# Patient Record
Sex: Female | Born: 1955 | Race: White | Hispanic: No | Marital: Married | Smoking: Former smoker
Health system: Southern US, Community
[De-identification: ages and names within clinical notes are randomized; demographics above are authoritative.]

## PROBLEM LIST (undated history)

## (undated) DIAGNOSIS — E785 Hyperlipidemia, unspecified: Secondary | ICD-10-CM

## (undated) DIAGNOSIS — I10 Essential (primary) hypertension: Secondary | ICD-10-CM

## (undated) DIAGNOSIS — M3313 Other dermatomyositis without myopathy: Secondary | ICD-10-CM

## (undated) HISTORY — DX: Essential (primary) hypertension: I10

## (undated) HISTORY — DX: Hyperlipidemia, unspecified: E78.5

## (undated) HISTORY — PX: KNEE ARTHROSCOPY: SHX127

## (undated) HISTORY — DX: Other dermatomyositis without myopathy: M33.13

## (undated) HISTORY — PX: CATARACT EXTRACTION: SUR2

## (undated) HISTORY — PX: WRIST SURGERY: SHX841

---

## 1998-07-23 ENCOUNTER — Other Ambulatory Visit: Admission: RE | Admit: 1998-07-23 | Discharge: 1998-07-23 | Payer: Self-pay | Admitting: Obstetrics and Gynecology

## 2003-04-21 ENCOUNTER — Other Ambulatory Visit: Admission: RE | Admit: 2003-04-21 | Discharge: 2003-04-21 | Payer: Self-pay | Admitting: Obstetrics and Gynecology

## 2010-09-17 ENCOUNTER — Ambulatory Visit
Admission: RE | Admit: 2010-09-17 | Discharge: 2010-09-17 | Payer: Self-pay | Source: Home / Self Care | Attending: Orthopedic Surgery | Admitting: Orthopedic Surgery

## 2019-01-09 ENCOUNTER — Other Ambulatory Visit: Payer: Self-pay | Admitting: Family Medicine

## 2019-01-09 DIAGNOSIS — F988 Other specified behavioral and emotional disorders with onset usually occurring in childhood and adolescence: Secondary | ICD-10-CM | POA: Diagnosis not present

## 2019-01-09 DIAGNOSIS — E7800 Pure hypercholesterolemia, unspecified: Secondary | ICD-10-CM | POA: Diagnosis not present

## 2019-01-09 DIAGNOSIS — K219 Gastro-esophageal reflux disease without esophagitis: Secondary | ICD-10-CM | POA: Diagnosis not present

## 2019-01-09 DIAGNOSIS — Z1231 Encounter for screening mammogram for malignant neoplasm of breast: Secondary | ICD-10-CM

## 2019-02-11 DIAGNOSIS — I349 Nonrheumatic mitral valve disorder, unspecified: Secondary | ICD-10-CM | POA: Diagnosis not present

## 2019-02-11 DIAGNOSIS — Z1211 Encounter for screening for malignant neoplasm of colon: Secondary | ICD-10-CM | POA: Diagnosis not present

## 2019-02-11 DIAGNOSIS — E782 Mixed hyperlipidemia: Secondary | ICD-10-CM | POA: Diagnosis not present

## 2019-02-14 DIAGNOSIS — H25812 Combined forms of age-related cataract, left eye: Secondary | ICD-10-CM | POA: Diagnosis not present

## 2019-02-14 DIAGNOSIS — H5213 Myopia, bilateral: Secondary | ICD-10-CM | POA: Diagnosis not present

## 2019-02-14 DIAGNOSIS — H40053 Ocular hypertension, bilateral: Secondary | ICD-10-CM | POA: Diagnosis not present

## 2019-02-14 DIAGNOSIS — H353131 Nonexudative age-related macular degeneration, bilateral, early dry stage: Secondary | ICD-10-CM | POA: Diagnosis not present

## 2019-03-18 DIAGNOSIS — Z1231 Encounter for screening mammogram for malignant neoplasm of breast: Secondary | ICD-10-CM | POA: Diagnosis not present

## 2019-04-18 ENCOUNTER — Ambulatory Visit: Payer: Self-pay

## 2019-04-23 DIAGNOSIS — K5730 Diverticulosis of large intestine without perforation or abscess without bleeding: Secondary | ICD-10-CM | POA: Diagnosis not present

## 2019-04-23 DIAGNOSIS — Z1211 Encounter for screening for malignant neoplasm of colon: Secondary | ICD-10-CM | POA: Diagnosis not present

## 2019-04-23 DIAGNOSIS — D122 Benign neoplasm of ascending colon: Secondary | ICD-10-CM | POA: Diagnosis not present

## 2019-04-23 DIAGNOSIS — K635 Polyp of colon: Secondary | ICD-10-CM | POA: Diagnosis not present

## 2019-05-17 DIAGNOSIS — L239 Allergic contact dermatitis, unspecified cause: Secondary | ICD-10-CM | POA: Diagnosis not present

## 2019-06-05 DIAGNOSIS — Z23 Encounter for immunization: Secondary | ICD-10-CM | POA: Diagnosis not present

## 2019-06-05 DIAGNOSIS — Z0000 Encounter for general adult medical examination without abnormal findings: Secondary | ICD-10-CM | POA: Diagnosis not present

## 2019-06-05 DIAGNOSIS — E7800 Pure hypercholesterolemia, unspecified: Secondary | ICD-10-CM | POA: Diagnosis not present

## 2019-06-05 DIAGNOSIS — Z136 Encounter for screening for cardiovascular disorders: Secondary | ICD-10-CM | POA: Diagnosis not present

## 2019-06-25 DIAGNOSIS — H25812 Combined forms of age-related cataract, left eye: Secondary | ICD-10-CM | POA: Diagnosis not present

## 2019-06-25 DIAGNOSIS — H268 Other specified cataract: Secondary | ICD-10-CM | POA: Diagnosis not present

## 2019-08-08 DIAGNOSIS — H40053 Ocular hypertension, bilateral: Secondary | ICD-10-CM | POA: Diagnosis not present

## 2020-02-06 DIAGNOSIS — M1711 Unilateral primary osteoarthritis, right knee: Secondary | ICD-10-CM | POA: Diagnosis not present

## 2020-02-25 DIAGNOSIS — S83281A Other tear of lateral meniscus, current injury, right knee, initial encounter: Secondary | ICD-10-CM | POA: Diagnosis not present

## 2020-03-05 DIAGNOSIS — M25561 Pain in right knee: Secondary | ICD-10-CM | POA: Diagnosis not present

## 2020-03-23 DIAGNOSIS — Z1231 Encounter for screening mammogram for malignant neoplasm of breast: Secondary | ICD-10-CM | POA: Diagnosis not present

## 2020-04-09 DIAGNOSIS — M1711 Unilateral primary osteoarthritis, right knee: Secondary | ICD-10-CM | POA: Diagnosis not present

## 2020-04-13 DIAGNOSIS — H6123 Impacted cerumen, bilateral: Secondary | ICD-10-CM | POA: Diagnosis not present

## 2020-04-29 DIAGNOSIS — H40053 Ocular hypertension, bilateral: Secondary | ICD-10-CM | POA: Diagnosis not present

## 2020-04-29 DIAGNOSIS — H26493 Other secondary cataract, bilateral: Secondary | ICD-10-CM | POA: Diagnosis not present

## 2020-04-29 DIAGNOSIS — H524 Presbyopia: Secondary | ICD-10-CM | POA: Diagnosis not present

## 2020-05-20 DIAGNOSIS — Z20828 Contact with and (suspected) exposure to other viral communicable diseases: Secondary | ICD-10-CM | POA: Diagnosis not present

## 2020-06-11 DIAGNOSIS — H26491 Other secondary cataract, right eye: Secondary | ICD-10-CM | POA: Diagnosis not present

## 2020-08-06 DIAGNOSIS — E7800 Pure hypercholesterolemia, unspecified: Secondary | ICD-10-CM | POA: Diagnosis not present

## 2020-08-10 DIAGNOSIS — Z0000 Encounter for general adult medical examination without abnormal findings: Secondary | ICD-10-CM | POA: Diagnosis not present

## 2020-08-10 DIAGNOSIS — H6091 Unspecified otitis externa, right ear: Secondary | ICD-10-CM | POA: Diagnosis not present

## 2020-08-10 DIAGNOSIS — E7800 Pure hypercholesterolemia, unspecified: Secondary | ICD-10-CM | POA: Diagnosis not present

## 2020-08-10 DIAGNOSIS — F988 Other specified behavioral and emotional disorders with onset usually occurring in childhood and adolescence: Secondary | ICD-10-CM | POA: Diagnosis not present

## 2020-08-10 DIAGNOSIS — Z23 Encounter for immunization: Secondary | ICD-10-CM | POA: Diagnosis not present

## 2020-08-10 DIAGNOSIS — F900 Attention-deficit hyperactivity disorder, predominantly inattentive type: Secondary | ICD-10-CM | POA: Diagnosis not present

## 2020-08-10 DIAGNOSIS — K219 Gastro-esophageal reflux disease without esophagitis: Secondary | ICD-10-CM | POA: Diagnosis not present

## 2020-08-10 DIAGNOSIS — L309 Dermatitis, unspecified: Secondary | ICD-10-CM | POA: Diagnosis not present

## 2020-08-10 DIAGNOSIS — R6889 Other general symptoms and signs: Secondary | ICD-10-CM | POA: Diagnosis not present

## 2020-08-11 DIAGNOSIS — Z23 Encounter for immunization: Secondary | ICD-10-CM | POA: Diagnosis not present

## 2020-08-31 DIAGNOSIS — R7989 Other specified abnormal findings of blood chemistry: Secondary | ICD-10-CM | POA: Diagnosis not present

## 2020-10-21 DIAGNOSIS — L821 Other seborrheic keratosis: Secondary | ICD-10-CM | POA: Diagnosis not present

## 2020-10-21 DIAGNOSIS — K130 Diseases of lips: Secondary | ICD-10-CM | POA: Diagnosis not present

## 2020-10-21 DIAGNOSIS — L578 Other skin changes due to chronic exposure to nonionizing radiation: Secondary | ICD-10-CM | POA: Diagnosis not present

## 2020-10-21 DIAGNOSIS — D485 Neoplasm of uncertain behavior of skin: Secondary | ICD-10-CM | POA: Diagnosis not present

## 2020-10-21 DIAGNOSIS — L089 Local infection of the skin and subcutaneous tissue, unspecified: Secondary | ICD-10-CM | POA: Diagnosis not present

## 2020-10-21 DIAGNOSIS — L814 Other melanin hyperpigmentation: Secondary | ICD-10-CM | POA: Diagnosis not present

## 2020-10-21 DIAGNOSIS — L309 Dermatitis, unspecified: Secondary | ICD-10-CM | POA: Diagnosis not present

## 2020-10-28 DIAGNOSIS — M3390 Dermatopolymyositis, unspecified, organ involvement unspecified: Secondary | ICD-10-CM | POA: Diagnosis not present

## 2020-10-29 DIAGNOSIS — M3390 Dermatopolymyositis, unspecified, organ involvement unspecified: Secondary | ICD-10-CM | POA: Diagnosis not present

## 2020-10-29 DIAGNOSIS — R059 Cough, unspecified: Secondary | ICD-10-CM | POA: Diagnosis not present

## 2020-11-04 DIAGNOSIS — Z124 Encounter for screening for malignant neoplasm of cervix: Secondary | ICD-10-CM | POA: Diagnosis not present

## 2020-11-04 DIAGNOSIS — M3310 Other dermatopolymyositis, organ involvement unspecified: Secondary | ICD-10-CM | POA: Diagnosis not present

## 2020-11-04 DIAGNOSIS — Z01411 Encounter for gynecological examination (general) (routine) with abnormal findings: Secondary | ICD-10-CM | POA: Diagnosis not present

## 2020-11-04 DIAGNOSIS — N83202 Unspecified ovarian cyst, left side: Secondary | ICD-10-CM | POA: Diagnosis not present

## 2020-11-04 DIAGNOSIS — Z01419 Encounter for gynecological examination (general) (routine) without abnormal findings: Secondary | ICD-10-CM | POA: Diagnosis not present

## 2020-11-04 DIAGNOSIS — Z1273 Encounter for screening for malignant neoplasm of ovary: Secondary | ICD-10-CM | POA: Diagnosis not present

## 2020-11-04 DIAGNOSIS — Z6825 Body mass index (BMI) 25.0-25.9, adult: Secondary | ICD-10-CM | POA: Diagnosis not present

## 2020-11-09 DIAGNOSIS — M3390 Dermatopolymyositis, unspecified, organ involvement unspecified: Secondary | ICD-10-CM | POA: Diagnosis not present

## 2020-11-10 DIAGNOSIS — R918 Other nonspecific abnormal finding of lung field: Secondary | ICD-10-CM | POA: Diagnosis not present

## 2020-11-12 DIAGNOSIS — Z6826 Body mass index (BMI) 26.0-26.9, adult: Secondary | ICD-10-CM | POA: Diagnosis not present

## 2020-11-12 DIAGNOSIS — M3390 Dermatopolymyositis, unspecified, organ involvement unspecified: Secondary | ICD-10-CM | POA: Diagnosis not present

## 2020-11-12 DIAGNOSIS — E663 Overweight: Secondary | ICD-10-CM | POA: Diagnosis not present

## 2020-11-18 DIAGNOSIS — Z1273 Encounter for screening for malignant neoplasm of ovary: Secondary | ICD-10-CM | POA: Diagnosis not present

## 2020-11-18 DIAGNOSIS — M3390 Dermatopolymyositis, unspecified, organ involvement unspecified: Secondary | ICD-10-CM | POA: Diagnosis not present

## 2020-12-02 DIAGNOSIS — I781 Nevus, non-neoplastic: Secondary | ICD-10-CM | POA: Diagnosis not present

## 2020-12-02 DIAGNOSIS — M3390 Dermatopolymyositis, unspecified, organ involvement unspecified: Secondary | ICD-10-CM | POA: Diagnosis not present

## 2020-12-02 DIAGNOSIS — Z79899 Other long term (current) drug therapy: Secondary | ICD-10-CM | POA: Diagnosis not present

## 2020-12-07 DIAGNOSIS — H40033 Anatomical narrow angle, bilateral: Secondary | ICD-10-CM | POA: Diagnosis not present

## 2020-12-08 DIAGNOSIS — M3390 Dermatopolymyositis, unspecified, organ involvement unspecified: Secondary | ICD-10-CM | POA: Diagnosis not present

## 2020-12-08 DIAGNOSIS — Z6826 Body mass index (BMI) 26.0-26.9, adult: Secondary | ICD-10-CM | POA: Diagnosis not present

## 2020-12-08 DIAGNOSIS — E663 Overweight: Secondary | ICD-10-CM | POA: Diagnosis not present

## 2020-12-14 DIAGNOSIS — H26492 Other secondary cataract, left eye: Secondary | ICD-10-CM | POA: Diagnosis not present

## 2020-12-14 DIAGNOSIS — H43813 Vitreous degeneration, bilateral: Secondary | ICD-10-CM | POA: Diagnosis not present

## 2020-12-14 DIAGNOSIS — H5213 Myopia, bilateral: Secondary | ICD-10-CM | POA: Diagnosis not present

## 2020-12-14 DIAGNOSIS — H04123 Dry eye syndrome of bilateral lacrimal glands: Secondary | ICD-10-CM | POA: Diagnosis not present

## 2020-12-17 ENCOUNTER — Encounter: Payer: Self-pay | Admitting: Pulmonary Disease

## 2020-12-17 ENCOUNTER — Institutional Professional Consult (permissible substitution): Payer: Self-pay | Admitting: Pulmonary Disease

## 2020-12-17 ENCOUNTER — Other Ambulatory Visit: Payer: Self-pay

## 2020-12-17 ENCOUNTER — Ambulatory Visit (INDEPENDENT_AMBULATORY_CARE_PROVIDER_SITE_OTHER): Payer: PPO | Admitting: Pulmonary Disease

## 2020-12-17 VITALS — BP 130/90 | HR 95 | Temp 97.7°F | Ht 64.5 in | Wt 153.2 lb

## 2020-12-17 DIAGNOSIS — J849 Interstitial pulmonary disease, unspecified: Secondary | ICD-10-CM

## 2020-12-17 DIAGNOSIS — M3390 Dermatopolymyositis, unspecified, organ involvement unspecified: Secondary | ICD-10-CM

## 2020-12-17 NOTE — Patient Instructions (Signed)
I have reviewed his CT scan which shows very minimal changes in the left side This does not look like interstitial lung disease from dermatomyositis which is reassuring Since you are feeling well we will continue to monitor this and order CT chest high-resolution in 3 months time Order PFTs in 3 months and follow-up in clinic after PFTs

## 2020-12-17 NOTE — Progress Notes (Signed)
Jessica Ellis    [id number]    Oct 04, 1955  Primary Care Physician:Kaplan, Baldemar Friday., PA-C  Referring Physician: Aletha Halim., PA-C [address],  [place] [address]  Chief complaint: Consult for abnormal CT  HPI: 65 year old with history of hyperlipidemia, recent diagnosis of dermatomyositis.  She has seen dermatology at [facility] and Dr. Amil Amen, rheumatology.  She is not on treatment yet and is planning on a second opinion at rheumatology clinic and [facility].  She underwent a CT scan for evaluation of her ILD.  It showed minimal tree-in-bud in the left lower lobe and has been referred here for further evaluation Symptoms of dermatomyositis are mostly skin changes.  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  No feather pillows or comforters Smoking history: 40-pack-year smoker.  Quit in 2014 Travel history: No significant travel history Relevant family history: Mom had emphysema.  She was a smoker.  Outpatient Encounter Medications as of 12/17/2020  Medication Sig  . amphetamine-dextroamphetamine (ADDERALL) 15 MG tablet Take 1 tablet BID  . Cholecalciferol 50 MCG (2000 UT) CAPS   . omeprazole (PRILOSEC) 20 MG capsule Take by mouth.  . timolol (TIMOPTIC) 0.5 % ophthalmic solution 1 drop 2 times daily.  . Timolol-Brimon-Dorzol-Latanopr 0.5-0.15-2 -0.005% SOLN    No facility-administered encounter medications on file as of 12/17/2020.    Allergies as of 12/17/2020  . (Not on File)    No past medical history on file.  History reviewed. No pertinent surgical history.  No family history on file.  Social History   Socioeconomic History  . Marital status: Married    Spouse name: Not on file  . Number of children: Not on file  . Years of education: Not on file  . Highest education level: Not  on file  Occupational History  . Not on file  Tobacco Use  . Smoking status: Former Smoker    Packs/day: 1.00    Years: 40.00    Pack years: 40.00    Quit date: 12/17/2012    Years since quitting: 8.0  . Smokeless tobacco: Never Used  Substance and Sexual Activity  . Alcohol use: Not on file  . Drug use: Not on file  . Sexual activity: Not on file  Other Topics Concern  . Not on file  Social History Narrative  . Not on file   Social Determinants of Health   Financial Resource Strain: Not on file  Food Insecurity: Not on file  Transportation Needs: Not on file  Physical Activity: Not on file  Stress: Not on file  Social Connections: Not on file  Intimate Partner Violence: Not on file    Review of systems: Review of Systems  Constitutional: Negative for fever and chills.  HENT: Negative.   Eyes: Negative for blurred vision.  Respiratory: as per HPI  Cardiovascular: Negative for chest pain and palpitations.  Gastrointestinal: Negative for vomiting, diarrhea, blood per rectum. Genitourinary: Negative for dysuria, urgency, frequency and hematuria.  Musculoskeletal: Negative for myalgias, back pain and joint pain.  Skin: Negative for itching and rash.  Neurological: Negative for dizziness, tremors, focal weakness, seizures and loss of consciousness.  Endo/Heme/Allergies: Negative for environmental allergies.  Psychiatric/Behavioral: Negative for depression, suicidal ideas and hallucinations.  All other systems reviewed and are negative.  Physical Exam: Blood pressure 130/90, pulse 95,  temperature 97.7 F (36.5 C), temperature source Temporal, height 5' 4.5" (1.638 m), weight 153 lb 3.2 oz (69.5 kg), SpO2 97 %. Gen:      No acute distress HEENT:  EOMI, sclera anicteric Neck:     No masses; no thyromegaly Lungs:    Clear to auscultation bilaterally; normal respiratory effort CV:         Regular rate and rhythm; no murmurs Abd:      + bowel sounds; soft, non-tender; no  palpable masses, no distension Ext:    No edema; adequate peripheral perfusion Skin:      Warm and dry; no rash Neuro: alert and oriented x 3 Psych: normal mood and affect  Data Reviewed: Imaging: CT chest 11/10/2020-report in care everywhere Tree-in-bud nodularity in the left lower lobe  PFTs:  Labs:  Assessment:  Abnormal CT, history of dermatomyositis I have reviewed the scan which she brought on a disc.  There is very minimal nodularity in the left lower lobe.  This is nonspecific.  She does not have any symptoms of infection No evidence of interstitial lung disease  Reassured the patient Get follow-up CT high-resolution in 3 months for recheck Check PFTs and follow-up in clinic  Plan/Recommendations: High-res CT in 3 months PFTs   Marshell Garfinkel MD [facility] 12/17/2020, 11:47 AM  CC: Aletha Halim., PA-C

## 2020-12-24 DIAGNOSIS — K219 Gastro-esophageal reflux disease without esophagitis: Secondary | ICD-10-CM | POA: Diagnosis not present

## 2020-12-24 DIAGNOSIS — M3390 Dermatopolymyositis, unspecified, organ involvement unspecified: Secondary | ICD-10-CM | POA: Diagnosis not present

## 2020-12-28 DIAGNOSIS — M60821 Other myositis, right upper arm: Secondary | ICD-10-CM | POA: Diagnosis not present

## 2020-12-29 DIAGNOSIS — M7989 Other specified soft tissue disorders: Secondary | ICD-10-CM | POA: Diagnosis not present

## 2020-12-29 DIAGNOSIS — M3390 Dermatopolymyositis, unspecified, organ involvement unspecified: Secondary | ICD-10-CM | POA: Diagnosis not present

## 2020-12-29 DIAGNOSIS — E663 Overweight: Secondary | ICD-10-CM | POA: Diagnosis not present

## 2020-12-29 DIAGNOSIS — Z6825 Body mass index (BMI) 25.0-25.9, adult: Secondary | ICD-10-CM | POA: Diagnosis not present

## 2021-01-26 DIAGNOSIS — M3390 Dermatopolymyositis, unspecified, organ involvement unspecified: Secondary | ICD-10-CM | POA: Diagnosis not present

## 2021-01-26 DIAGNOSIS — M6281 Muscle weakness (generalized): Secondary | ICD-10-CM | POA: Diagnosis not present

## 2021-01-26 DIAGNOSIS — Z6824 Body mass index (BMI) 24.0-24.9, adult: Secondary | ICD-10-CM | POA: Diagnosis not present

## 2021-01-29 DIAGNOSIS — M6281 Muscle weakness (generalized): Secondary | ICD-10-CM | POA: Diagnosis not present

## 2021-02-04 DIAGNOSIS — M3390 Dermatopolymyositis, unspecified, organ involvement unspecified: Secondary | ICD-10-CM | POA: Diagnosis not present

## 2021-02-04 DIAGNOSIS — M6281 Muscle weakness (generalized): Secondary | ICD-10-CM | POA: Diagnosis not present

## 2021-02-10 DIAGNOSIS — M6281 Muscle weakness (generalized): Secondary | ICD-10-CM | POA: Diagnosis not present

## 2021-02-23 ENCOUNTER — Ambulatory Visit
Admission: RE | Admit: 2021-02-23 | Discharge: 2021-02-23 | Disposition: A | Discharge status: Home / Self Care | Payer: PPO | Source: Ambulatory Visit | Attending: Pulmonary Disease | Admitting: Pulmonary Disease

## 2021-02-23 ENCOUNTER — Other Ambulatory Visit: Payer: Self-pay

## 2021-02-23 DIAGNOSIS — J849 Interstitial pulmonary disease, unspecified: Secondary | ICD-10-CM

## 2021-02-23 DIAGNOSIS — M3390 Dermatopolymyositis, unspecified, organ involvement unspecified: Secondary | ICD-10-CM

## 2021-02-23 DIAGNOSIS — R918 Other nonspecific abnormal finding of lung field: Secondary | ICD-10-CM | POA: Diagnosis not present

## 2021-02-25 DIAGNOSIS — M6281 Muscle weakness (generalized): Secondary | ICD-10-CM | POA: Diagnosis not present

## 2021-03-03 DIAGNOSIS — M6281 Muscle weakness (generalized): Secondary | ICD-10-CM | POA: Diagnosis not present

## 2021-03-10 DIAGNOSIS — M6281 Muscle weakness (generalized): Secondary | ICD-10-CM | POA: Diagnosis not present

## 2021-03-15 ENCOUNTER — Other Ambulatory Visit: Payer: PPO

## 2021-03-15 ENCOUNTER — Other Ambulatory Visit (HOSPITAL_COMMUNITY)
Admission: RE | Admit: 2021-03-15 | Discharge: 2021-03-15 | Disposition: A | Discharge status: Home / Self Care | Payer: PPO | Source: Ambulatory Visit | Attending: Pulmonary Disease | Admitting: Pulmonary Disease

## 2021-03-15 DIAGNOSIS — Z20822 Contact with and (suspected) exposure to covid-19: Secondary | ICD-10-CM | POA: Diagnosis not present

## 2021-03-15 DIAGNOSIS — Z01812 Encounter for preprocedural laboratory examination: Secondary | ICD-10-CM | POA: Insufficient documentation

## 2021-03-15 LAB — SARS CORONAVIRUS 2 (TAT 6-24 HRS): SARS Coronavirus 2: NEGATIVE

## 2021-03-17 DIAGNOSIS — M6281 Muscle weakness (generalized): Secondary | ICD-10-CM | POA: Diagnosis not present

## 2021-03-18 ENCOUNTER — Ambulatory Visit (INDEPENDENT_AMBULATORY_CARE_PROVIDER_SITE_OTHER): Payer: PPO | Admitting: Pulmonary Disease

## 2021-03-18 ENCOUNTER — Other Ambulatory Visit: Payer: Self-pay

## 2021-03-18 DIAGNOSIS — M3390 Dermatopolymyositis, unspecified, organ involvement unspecified: Secondary | ICD-10-CM

## 2021-03-18 DIAGNOSIS — J849 Interstitial pulmonary disease, unspecified: Secondary | ICD-10-CM

## 2021-03-18 LAB — PULMONARY FUNCTION TEST
DL/VA % pred: 95 %
DL/VA: 3.98 ml/min/mmHg/L
DLCO cor % pred: 104 %
DLCO cor: 20.55 ml/min/mmHg
DLCO unc % pred: 104 %
DLCO unc: 20.55 ml/min/mmHg
FEF 25-75 Post: 1.7 L/sec
FEF 25-75 Pre: 1.54 L/sec
FEF2575-%Change-Post: 10 %
FEF2575-%Pred-Post: 83 %
FEF2575-%Pred-Pre: 75 %
FEV1-%Change-Post: 1 %
FEV1-%Pred-Post: 91 %
FEV1-%Pred-Pre: 89 %
FEV1-Post: 2.15 L
FEV1-Pre: 2.12 L
FEV1FVC-%Change-Post: 0 %
FEV1FVC-%Pred-Pre: 96 %
FEV6-%Change-Post: 1 %
FEV6-%Pred-Post: 97 %
FEV6-%Pred-Pre: 95 %
FEV6-Post: 2.88 L
FEV6-Pre: 2.83 L
FEV6FVC-%Change-Post: 0 %
FEV6FVC-%Pred-Post: 104 %
FEV6FVC-%Pred-Pre: 103 %
FVC-%Change-Post: 0 %
FVC-%Pred-Post: 93 %
FVC-%Pred-Pre: 92 %
FVC-Post: 2.88 L
FVC-Pre: 2.86 L
Post FEV1/FVC ratio: 75 %
Post FEV6/FVC ratio: 100 %
Pre FEV1/FVC ratio: 74 %
Pre FEV6/FVC Ratio: 99 %
RV % pred: 96 %
RV: 2.06 L
TLC % pred: 97 %
TLC: 4.93 L

## 2021-03-18 NOTE — Progress Notes (Signed)
PFT done today. 

## 2021-03-22 DIAGNOSIS — M3390 Dermatopolymyositis, unspecified, organ involvement unspecified: Secondary | ICD-10-CM | POA: Diagnosis not present

## 2021-03-24 DIAGNOSIS — M6281 Muscle weakness (generalized): Secondary | ICD-10-CM | POA: Diagnosis not present

## 2021-03-29 ENCOUNTER — Ambulatory Visit: Payer: PPO | Admitting: Pulmonary Disease

## 2021-03-29 ENCOUNTER — Encounter: Payer: Self-pay | Admitting: Pulmonary Disease

## 2021-03-29 ENCOUNTER — Other Ambulatory Visit: Payer: Self-pay

## 2021-03-29 VITALS — BP 122/76 | HR 83 | Ht 64.0 in | Wt 152.0 lb

## 2021-03-29 DIAGNOSIS — J849 Interstitial pulmonary disease, unspecified: Secondary | ICD-10-CM

## 2021-03-29 DIAGNOSIS — M3390 Dermatopolymyositis, unspecified, organ involvement unspecified: Secondary | ICD-10-CM

## 2021-03-29 DIAGNOSIS — Z1231 Encounter for screening mammogram for malignant neoplasm of breast: Secondary | ICD-10-CM | POA: Diagnosis not present

## 2021-03-29 NOTE — Patient Instructions (Signed)
I have reviewed his CT scan which does not show any interstitial lung disease There is notation that cirrhosis may be present.  Please discuss with your primary care regarding this Your lung function tests are normal which is good news Will order repeat CT scan in 1 year and follow-up in 1 year after scan.

## 2021-03-29 NOTE — Progress Notes (Signed)
Jessica Ellis    [id number]    13-May-1956  Primary Care Physician:Kaplan, Baldemar Friday., PA-C  Referring Physician: Aletha Halim., PA-C [address]  [place] [address]  Chief complaint: Follow-up for interstitial lung disease evaluation, abnormal CT scan  HPI: 65 year old with history of hyperlipidemia, recent diagnosis of dermatomyositis.  She has seen dermatology at [facility] and Dr. Amil Amen, rheumatology.    She underwent a CT scan for evaluation of her ILD.  It showed minimal tree-in-bud in the left lower lobe and has been referred here for further evaluation Symptoms of dermatomyositis are mostly skin changes.  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  No feather pillows or comforters Smoking history: 40-pack-year smoker.  Quit in 2014 Travel history: No significant travel history Relevant family history: Mom had emphysema.  She was a smoker.  Interim history: She is here for review of CT scan and PFTs for evaluation of interstitial lung disease in the setting of dermatomyositis  States that breathing is normal with no issues, feeling well  She has seen rheumatology at [facility] for a second opinion and has started on CellCept 1000 mg twice daily  Outpatient Encounter Medications as of 03/29/2021  Medication Sig   amphetamine-dextroamphetamine (ADDERALL) 15 MG tablet Take 1 tablet BID   Cholecalciferol 50 MCG (2000 UT) CAPS    mycophenolate (CELLCEPT) 500 MG tablet Take 1,000 mg by mouth 2 (two) times daily. Takes 1000 mg  2 times a day.   omeprazole (PRILOSEC) 20 MG capsule Take by mouth.   timolol (TIMOPTIC) 0.5 % ophthalmic solution 1 drop 2 times daily.   [DISCONTINUED] Timolol-Brimon-Dorzol-Latanopr 0.5-0.15-2 -0.005% SOLN    No facility-administered encounter medications on file as of  03/29/2021.    Physical Exam: Blood pressure 122/76, pulse 83, height 5\' 4"  (1.626 m), weight 152 lb (68.9 kg), SpO2 97 %. Gen:      No acute distress HEENT:  EOMI, sclera anicteric Neck:     No masses; no thyromegaly Lungs:    Clear to auscultation bilaterally; normal respiratory effort CV:         Regular rate and rhythm; no murmurs Abd:      + bowel sounds; soft, non-tender; no palpable masses, no distension Ext:    No edema; adequate peripheral perfusion Skin:      Warm and dry; no rash Neuro: alert and oriented x 3 Psych: normal mood and affect   Data Reviewed: Imaging: CT chest 11/10/2020-report in care everywhere Tree-in-bud nodularity in the left lower lobe  High-resolution CT 02/23/2021-no interstitial lung disease, minimal air trapping, 2 mm nodule in the right lower lobe. iregular liver margin, atherosclerosis I have reviewed the images personally.  PFTs: 03/18/2021 FVC 2.88 [93%], FEV1 2.15 [91%], F/F 75, TLC 4.93 [97%], DLCO 20.55 [104%] Normal test   Labs:  Assessment:  Abnormal CT, history of dermatomyositis Prior CT at [facility] showed very minimal nonspecific nodularity in the left lower lobe.  Her follow-up high-resolution CT shows improvement in the nodularity.  There is no evidence of interstitial lung disease PFTs are normal  Continue CellCept per rheumatology High-res CT in 1 year for monitoring  I have reviewed other CT findings suggestive of liver cirrhosis.  She does not drink or have any other risk factors for liver issues.  She plans on  following up with her primary care regarding this and does not want hepatic panel done today  Plan/Recommendations: High-res CT in 1 year   Marshell Garfinkel MD [facility] 03/29/2021, 10:23 AM  CC: Aletha Halim., PA-C

## 2021-03-30 DIAGNOSIS — E663 Overweight: Secondary | ICD-10-CM | POA: Diagnosis not present

## 2021-03-30 DIAGNOSIS — M3390 Dermatopolymyositis, unspecified, organ involvement unspecified: Secondary | ICD-10-CM | POA: Diagnosis not present

## 2021-03-30 DIAGNOSIS — Z6826 Body mass index (BMI) 26.0-26.9, adult: Secondary | ICD-10-CM | POA: Diagnosis not present

## 2021-03-31 DIAGNOSIS — M6281 Muscle weakness (generalized): Secondary | ICD-10-CM | POA: Diagnosis not present

## 2021-04-07 DIAGNOSIS — M6281 Muscle weakness (generalized): Secondary | ICD-10-CM | POA: Diagnosis not present

## 2021-04-14 DIAGNOSIS — M6281 Muscle weakness (generalized): Secondary | ICD-10-CM | POA: Diagnosis not present

## 2021-04-15 DIAGNOSIS — Z20822 Contact with and (suspected) exposure to covid-19: Secondary | ICD-10-CM | POA: Diagnosis not present

## 2021-04-17 DIAGNOSIS — U071 COVID-19: Secondary | ICD-10-CM | POA: Diagnosis not present

## 2021-04-28 DIAGNOSIS — M6281 Muscle weakness (generalized): Secondary | ICD-10-CM | POA: Diagnosis not present

## 2021-05-05 DIAGNOSIS — M6281 Muscle weakness (generalized): Secondary | ICD-10-CM | POA: Diagnosis not present

## 2021-05-12 DIAGNOSIS — M6281 Muscle weakness (generalized): Secondary | ICD-10-CM | POA: Diagnosis not present

## 2021-05-26 DIAGNOSIS — M6281 Muscle weakness (generalized): Secondary | ICD-10-CM | POA: Diagnosis not present

## 2021-06-02 DIAGNOSIS — M6281 Muscle weakness (generalized): Secondary | ICD-10-CM | POA: Diagnosis not present

## 2021-06-04 DIAGNOSIS — E663 Overweight: Secondary | ICD-10-CM | POA: Diagnosis not present

## 2021-06-04 DIAGNOSIS — M3390 Dermatopolymyositis, unspecified, organ involvement unspecified: Secondary | ICD-10-CM | POA: Diagnosis not present

## 2021-06-04 DIAGNOSIS — Z7952 Long term (current) use of systemic steroids: Secondary | ICD-10-CM | POA: Diagnosis not present

## 2021-06-04 DIAGNOSIS — Z6827 Body mass index (BMI) 27.0-27.9, adult: Secondary | ICD-10-CM | POA: Diagnosis not present

## 2021-06-18 DIAGNOSIS — Z79899 Other long term (current) drug therapy: Secondary | ICD-10-CM | POA: Diagnosis not present

## 2021-06-18 DIAGNOSIS — M3390 Dermatopolymyositis, unspecified, organ involvement unspecified: Secondary | ICD-10-CM | POA: Diagnosis not present

## 2021-06-18 DIAGNOSIS — Z7952 Long term (current) use of systemic steroids: Secondary | ICD-10-CM | POA: Diagnosis not present

## 2021-06-30 DIAGNOSIS — G4700 Insomnia, unspecified: Secondary | ICD-10-CM | POA: Diagnosis not present

## 2021-06-30 DIAGNOSIS — I10 Essential (primary) hypertension: Secondary | ICD-10-CM | POA: Diagnosis not present

## 2021-07-16 DIAGNOSIS — H26492 Other secondary cataract, left eye: Secondary | ICD-10-CM | POA: Diagnosis not present

## 2021-07-16 DIAGNOSIS — H43813 Vitreous degeneration, bilateral: Secondary | ICD-10-CM | POA: Diagnosis not present

## 2021-07-16 DIAGNOSIS — H40053 Ocular hypertension, bilateral: Secondary | ICD-10-CM | POA: Diagnosis not present

## 2021-08-05 DIAGNOSIS — Z20822 Contact with and (suspected) exposure to covid-19: Secondary | ICD-10-CM | POA: Diagnosis not present

## 2021-08-05 DIAGNOSIS — R509 Fever, unspecified: Secondary | ICD-10-CM | POA: Diagnosis not present

## 2021-08-05 DIAGNOSIS — R051 Acute cough: Secondary | ICD-10-CM | POA: Diagnosis not present

## 2021-08-08 DIAGNOSIS — R051 Acute cough: Secondary | ICD-10-CM | POA: Diagnosis not present

## 2021-08-10 DIAGNOSIS — R7989 Other specified abnormal findings of blood chemistry: Secondary | ICD-10-CM | POA: Diagnosis not present

## 2021-08-10 DIAGNOSIS — I10 Essential (primary) hypertension: Secondary | ICD-10-CM | POA: Diagnosis not present

## 2021-08-10 DIAGNOSIS — E7800 Pure hypercholesterolemia, unspecified: Secondary | ICD-10-CM | POA: Diagnosis not present

## 2021-08-11 DIAGNOSIS — R0789 Other chest pain: Secondary | ICD-10-CM | POA: Diagnosis not present

## 2021-08-11 DIAGNOSIS — I10 Essential (primary) hypertension: Secondary | ICD-10-CM | POA: Diagnosis not present

## 2021-08-11 DIAGNOSIS — M3390 Dermatopolymyositis, unspecified, organ involvement unspecified: Secondary | ICD-10-CM | POA: Diagnosis not present

## 2021-08-11 DIAGNOSIS — R051 Acute cough: Secondary | ICD-10-CM | POA: Diagnosis not present

## 2021-08-11 DIAGNOSIS — E7800 Pure hypercholesterolemia, unspecified: Secondary | ICD-10-CM | POA: Diagnosis not present

## 2021-08-11 DIAGNOSIS — Z0000 Encounter for general adult medical examination without abnormal findings: Secondary | ICD-10-CM | POA: Diagnosis not present

## 2021-08-11 DIAGNOSIS — K219 Gastro-esophageal reflux disease without esophagitis: Secondary | ICD-10-CM | POA: Diagnosis not present

## 2021-08-11 DIAGNOSIS — G4700 Insomnia, unspecified: Secondary | ICD-10-CM | POA: Diagnosis not present

## 2021-08-12 DIAGNOSIS — R0789 Other chest pain: Secondary | ICD-10-CM | POA: Diagnosis not present

## 2021-08-12 DIAGNOSIS — R051 Acute cough: Secondary | ICD-10-CM | POA: Diagnosis not present

## 2021-09-03 DIAGNOSIS — E663 Overweight: Secondary | ICD-10-CM | POA: Diagnosis not present

## 2021-09-03 DIAGNOSIS — Z7952 Long term (current) use of systemic steroids: Secondary | ICD-10-CM | POA: Diagnosis not present

## 2021-09-03 DIAGNOSIS — M3390 Dermatopolymyositis, unspecified, organ involvement unspecified: Secondary | ICD-10-CM | POA: Diagnosis not present

## 2021-09-03 DIAGNOSIS — Z6829 Body mass index (BMI) 29.0-29.9, adult: Secondary | ICD-10-CM | POA: Diagnosis not present

## 2022-02-24 IMAGING — CT CT CHEST HIGH RESOLUTION W/O CM
1 of 4 series · 14 of 31 positions shown, 18 images · non-contrast
Comparison: None.

CLINICAL DATA: Dermatomyositis, interstitial lung disease.

EXAM:
CT CHEST WITHOUT CONTRAST
TECHNIQUE: Multidetector CT imaging of the chest was performed following the
standard protocol without intravenous contrast. High resolution
imaging of the lungs, as well as inspiratory and expiratory imaging,
was performed.

[Series 2: chest · axial · 0.76mm/px · z∈[-316,-42]mm · 14 of 155 slices shown, 18 images]
[im 9/155  mediastinal]
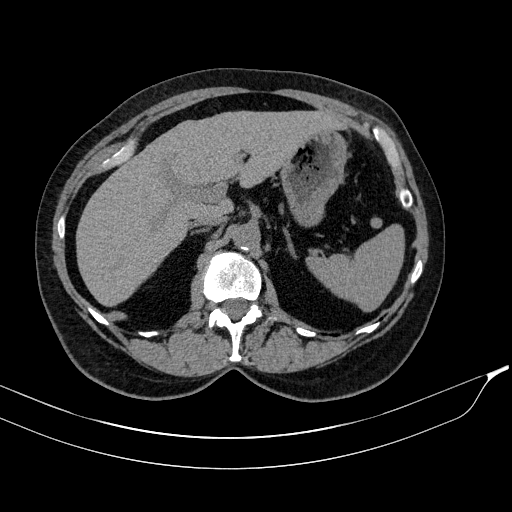
[im 9/155  lung]
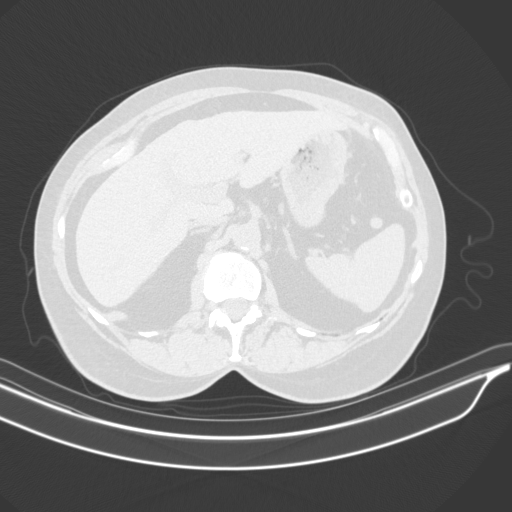
[im 18/155  lung]
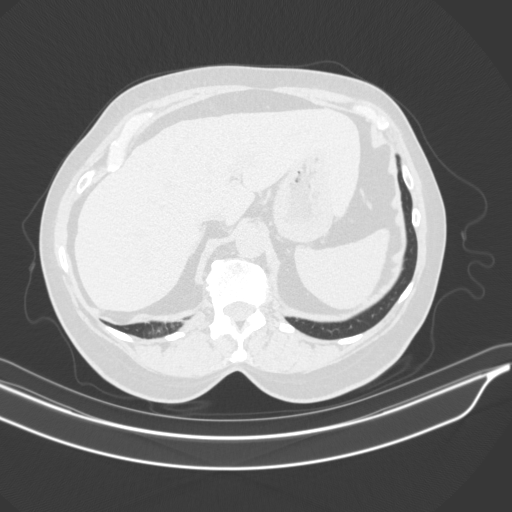
[im 35/155  lung]
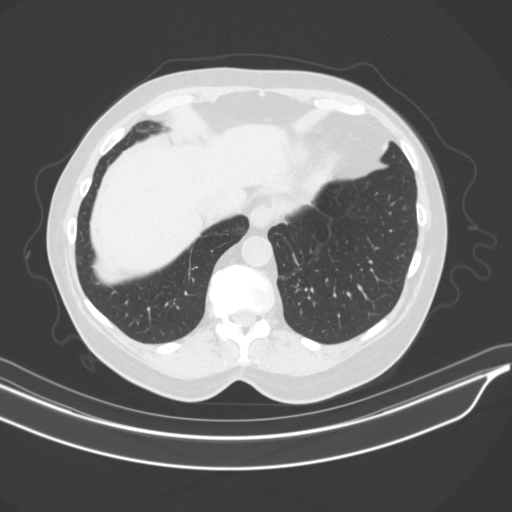
[im 43/155  lung]
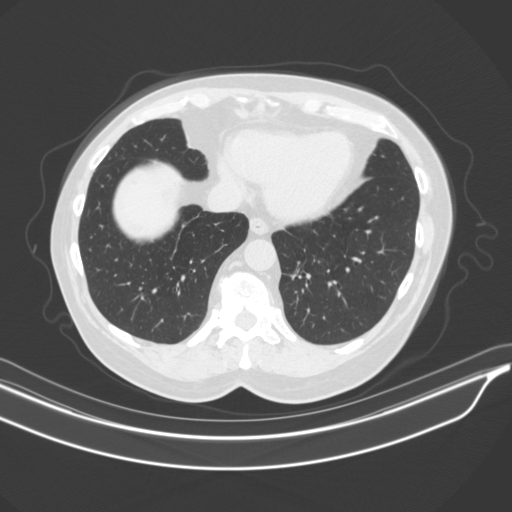
[im 52/155  mediastinal]
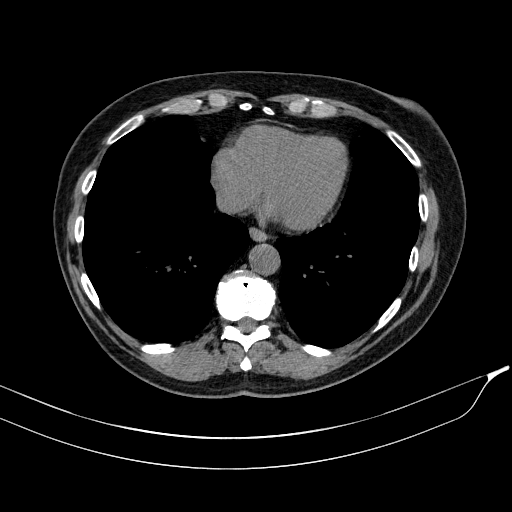
[im 52/155  lung]
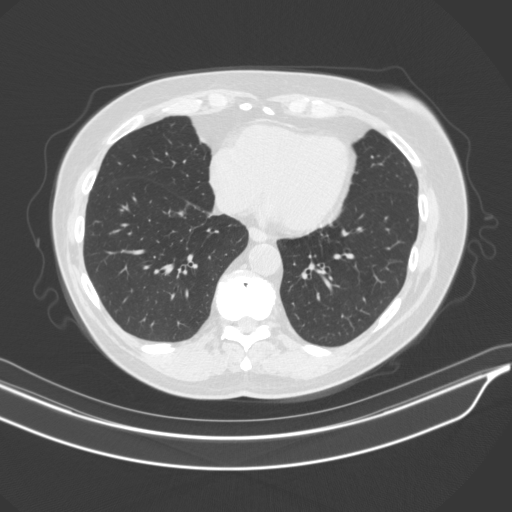
[im 69/155  lung]
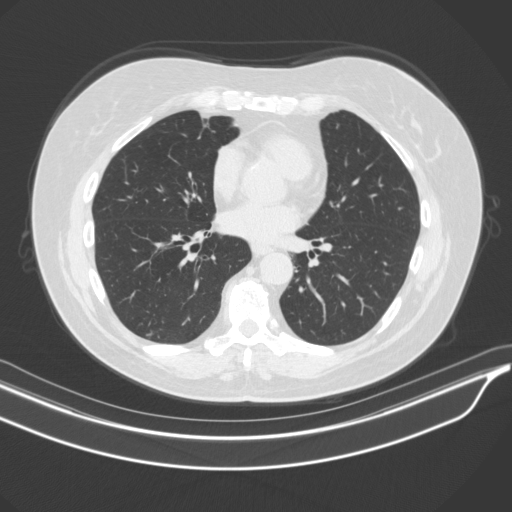
[im 73/155  lung]
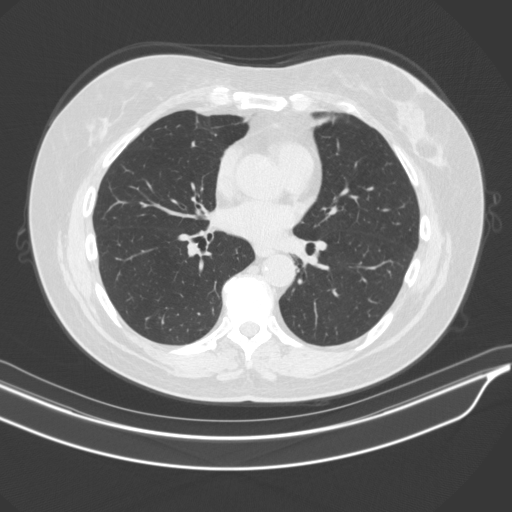
[im 78/155  lung]
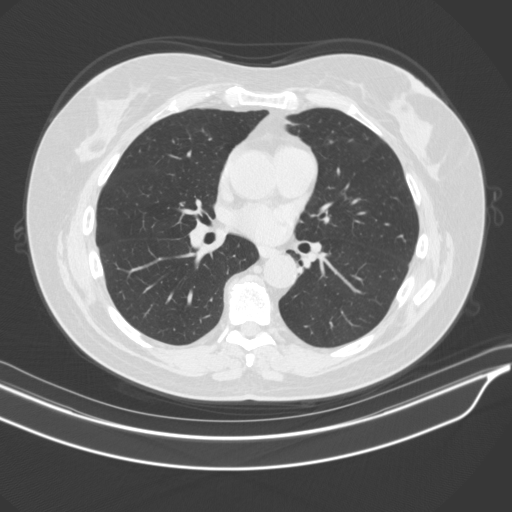
[im 86/155  mediastinal]
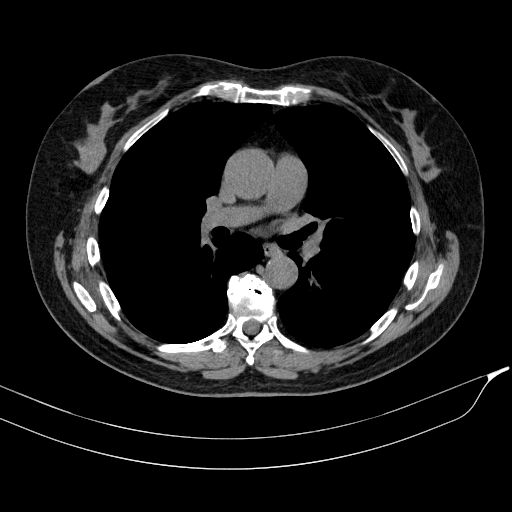
[im 86/155  lung]
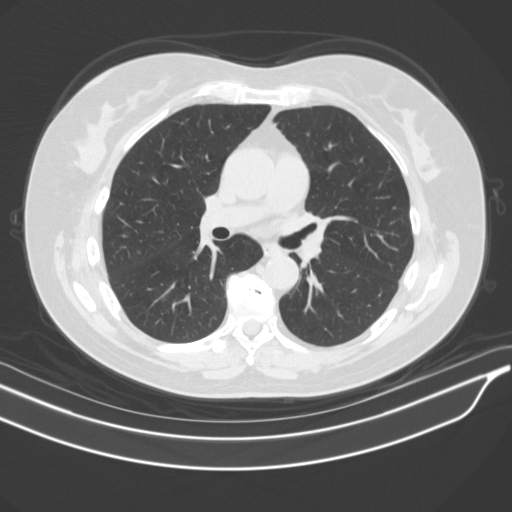
[im 103/155  lung]
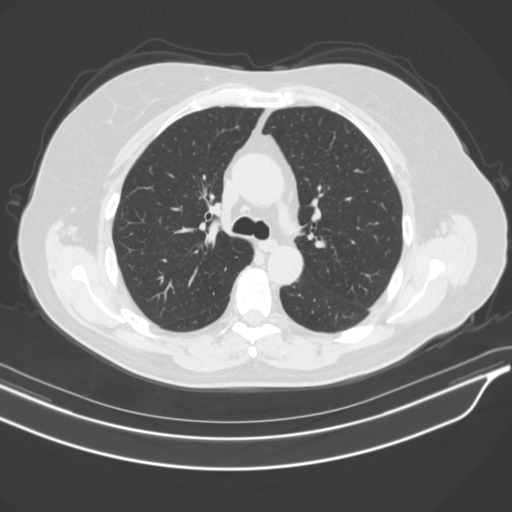
[im 112/155  lung]
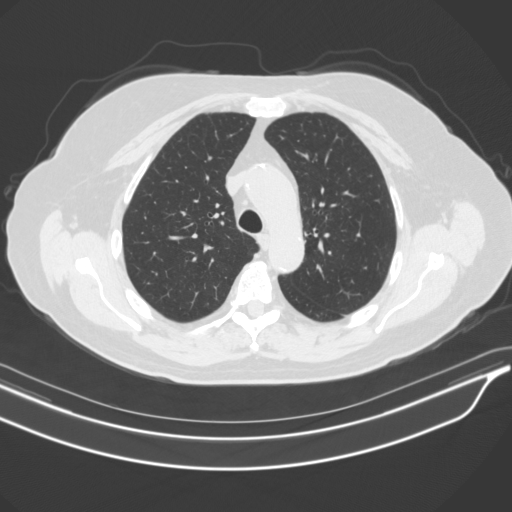
[im 120/155  lung]
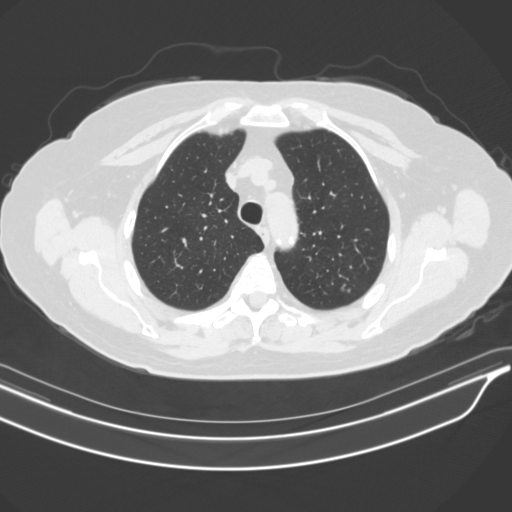
[im 137/155  mediastinal]
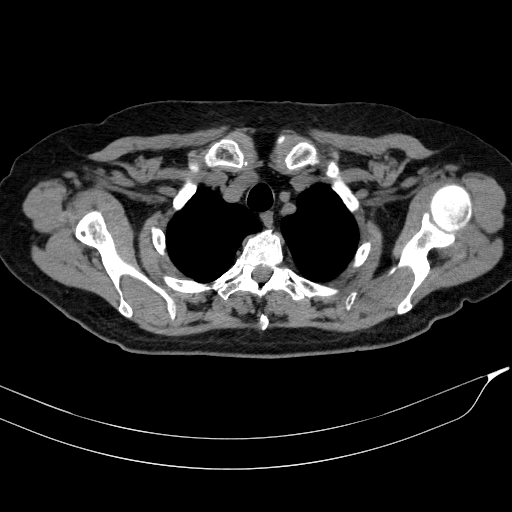
[im 137/155  lung]
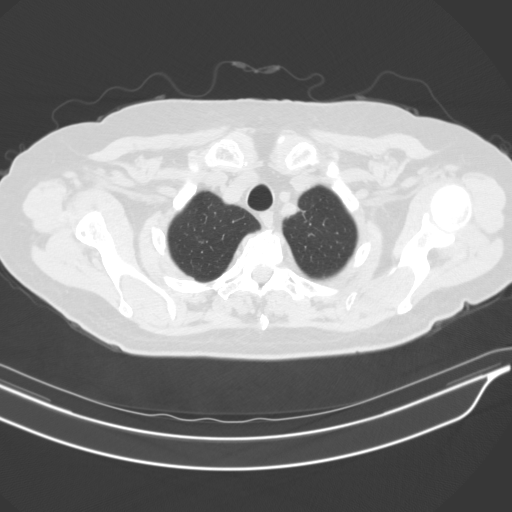
[im 146/155  lung]
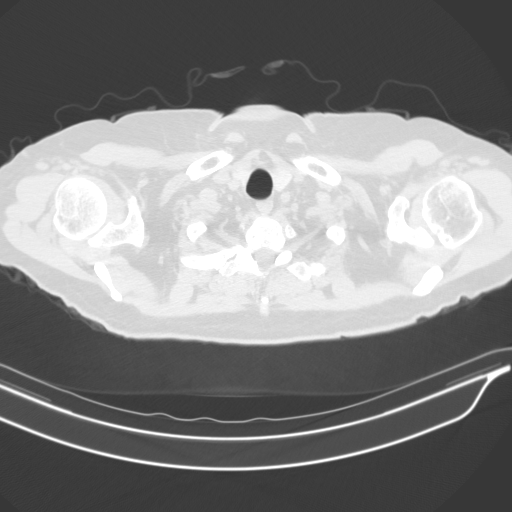

[14 of 31 positions shown; findings below may reference images not displayed]

FINDINGS: Cardiovascular: Atherosclerotic calcification of the aorta. Heart
size normal. No pericardial effusion.

Mediastinum/Nodes: No pathologically enlarged mediastinal or
axillary lymph nodes. Hilar regions are difficult to definitively
evaluate without IV contrast but appear grossly unremarkable.
Esophagus is grossly unremarkable.

Lungs/Pleura: Negative for subpleural reticulation, traction
bronchiectasis/bronchiolectasis, ground-glass, architectural
distortion or honeycombing. Minimal scattered pulmonary parenchymal
scarring. 2 mm nodule in the superior segment right lower lobe
(7/65). Minimal scattered peribronchovascular nodularity, likely
postinfectious in etiology. No pleural fluid. Airway is
unremarkable. There is air trapping.

Upper Abdomen: Liver margin may be slightly irregular. Visualized
portions of the liver and right adrenal gland are unremarkable.
Slight thickening of the left adrenal gland. Visualized portions of
the kidneys, spleen, pancreas, stomach and bowel are grossly
unremarkable.

Musculoskeletal: Degenerative changes in the spine. No worrisome
lytic or sclerotic lesions.
IMPRESSION: 1. No evidence of interstitial lung disease. Air trapping is
indicative of small airways disease.
2. 2 mm superior segment right lower lobe nodule. No follow-up
needed if patient is low-risk. Non-contrast chest CT can be
considered in 12 months if patient is high-risk. This recommendation
follows the consensus statement: Guidelines for Management of
Incidental Pulmonary Nodules Detected on CT Images: From the
3. Liver margin may be slightly irregular, raising suspicion for
cirrhosis.
4.  Aortic atherosclerosis ([id number]-CA8.8).

## 2022-03-29 ENCOUNTER — Ambulatory Visit
Admission: RE | Admit: 2022-03-29 | Discharge: 2022-03-29 | Disposition: A | Discharge status: Home / Self Care | Payer: PPO | Source: Ambulatory Visit | Attending: Family Medicine | Admitting: Family Medicine

## 2022-03-29 DIAGNOSIS — J849 Interstitial pulmonary disease, unspecified: Secondary | ICD-10-CM

## 2022-04-14 ENCOUNTER — Telehealth: Payer: Self-pay | Admitting: Pulmonary Disease

## 2022-04-14 DIAGNOSIS — J849 Interstitial pulmonary disease, unspecified: Secondary | ICD-10-CM

## 2022-04-15 NOTE — Telephone Encounter (Signed)
Called patient and went over CT results. Order placed for next year. Nothing further needed

## 2022-08-17 ENCOUNTER — Ambulatory Visit: Payer: PPO | Admitting: Pulmonary Disease

## 2022-08-17 ENCOUNTER — Encounter: Payer: Self-pay | Admitting: Pulmonary Disease

## 2022-08-17 VITALS — BP 136/78 | HR 64 | Temp 98.0°F | Ht 64.0 in | Wt 162.2 lb

## 2022-08-17 DIAGNOSIS — J849 Interstitial pulmonary disease, unspecified: Secondary | ICD-10-CM

## 2022-08-17 NOTE — Addendum Note (Signed)
Addended by: Elton Sin on: 08/17/2022 04:06 PM   Modules accepted: Orders

## 2022-08-17 NOTE — Progress Notes (Signed)
Jessica Ellis    [id number]    September 12, 1956  Primary Care Physician:Kaplan, Baldemar Friday., PA-C  Referring Physician: Aletha Halim., PA-C [address]  [place] [address]  Chief complaint: Follow-up for interstitial lung disease evaluation, abnormal CT scan  HPI: 66 y.o.  with history of hyperlipidemia, recent diagnosis of dermatomyositis.  She has seen dermatology at [facility] and Dr. Amil Amen, rheumatology.    She underwent a CT scan for evaluation of her ILD.  It showed minimal tree-in-bud in the left lower lobe and has been referred here for further evaluation Symptoms of dermatomyositis are mostly skin changes.  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  No feather pillows or comforters Smoking history: 40-pack-year smoker.  Quit in 2014 Travel history: No significant travel history Relevant family history: Mom had emphysema.  She was a smoker.  Interim history: States that breathing is normal with no issues, feeling well She continues follow-up with rheumatology at [facility] and Dr. Amil Amen.  Currently on CellCept at 1.5 g twice daily.  She also has been started on IVIG every 5 weeks.  Continues on prednisone at 5 mg/day.   Outpatient Encounter Medications as of 08/17/2022  Medication Sig   amphetamine-dextroamphetamine (ADDERALL) 15 MG tablet Take 1 tablet BID   Cholecalciferol 50 MCG (2000 UT) CAPS    mycophenolate (CELLCEPT) 500 MG tablet Take 1,000 mg by mouth 2 (two) times daily. Takes 1000 mg  2 times a day.   omeprazole (PRILOSEC) 20 MG capsule Take by mouth.   timolol (TIMOPTIC) 0.5 % ophthalmic solution 1 drop 2 times daily.   No facility-administered encounter  medications on file as of 08/17/2022.    Physical Exam: Blood pressure 136/78, pulse 64, temperature 98 F (36.7 C), temperature source Oral, height '5\' 4"'$  (1.626 m), weight 162 lb 3.2 oz (73.6 kg), SpO2 100 %. Gen:      No acute distress HEENT:  EOMI, sclera anicteric Neck:     No masses; no thyromegaly Lungs:    Clear to auscultation bilaterally; normal respiratory effort CV:         Regular rate and rhythm; no murmurs Abd:      + bowel sounds; soft, non-tender; no palpable masses, no distension Ext:    No edema; adequate peripheral perfusion Skin:      Warm and dry; no rash Neuro: alert and oriented x 3 Psych: normal mood and affect   Data Reviewed: Imaging: CT chest 11/10/2020-report in care everywhere Tree-in-bud nodularity in the left lower lobe  High-resolution CT 02/23/2021-no interstitial lung disease, minimal air trapping, 2 mm nodule in the right lower lobe. iregular liver margin, atherosclerosis I have reviewed the images personally.  PFTs: 03/18/2021 FVC 2.88 [93%], FEV1 2.15 [91%], F/F 75, TLC 4.93 [97%], DLCO 20.55 [104%] Normal test  Labs:  Assessment:  History of dermatomyositis, evaluation for interstitial lung disease Continues on CellCept, IVIG and prednisone per rheumatology No evidence of ILD on previous imaging She is asymptomatic today We will continue monitoring Order follow-up CT in 6 months  Plan/Recommendations: High-res CT in 6 months.   Marshell Garfinkel MD [facility] 08/17/2022, 1:02 PM  CC: [name],  Baldemar Friday., PA-C

## 2022-08-17 NOTE — Patient Instructions (Signed)
I am glad you are doing well with your breathing We will order a high-resolution CT for follow-up of the lungs in 6 months Return to clinic in 6 months after breathing test.

## 2023-03-14 ENCOUNTER — Telehealth: Payer: Self-pay | Admitting: Pulmonary Disease

## 2023-03-14 NOTE — Telephone Encounter (Signed)
Noted. Thanks.

## 2023-03-14 NOTE — Telephone Encounter (Signed)
Called patient to schedule the followup for her upcoming CT. She stated that she was intending to cancel her CT on 7/9 as her doctor at [facility] did not believe it was necessary. Wanted PM aware.

## 2023-04-11 ENCOUNTER — Ambulatory Visit (HOSPITAL_COMMUNITY): Payer: Medicare HMO

## 2023-10-10 NOTE — Progress Notes (Signed)
 Jessica Ellis MRN: [id number] DOB: Feb 03, 1956 (age: 68 y.o.)   Initial Visit Referred by:  Kaplan, Kristen Diane, * PCP: Josette Loa Aho, PA-C Chief Complaint:  Chief Complaint  Patient presents with  . Left Hand - New Patient    Chronic pain of left thumb    History of Present Illness: Jessica Ellis is a 68 year old left-hand-dominant female.  She is noted pain in bilateral thumb CMC joints over the past month or so and triggering of the right ring finger over the past 2 months.  She often has to use the other hand to straighten it out.  It is minimally painful when it occurs.  She states things have gotten better recently.  She had noted triggering of the left thumb which has mostly resolved.  She has difficulty opening jars pulling up socks or doing buttons due to the pain in the [facility] joints.  She has had no treatment thus far.  Allergies: No Known Allergies  Past Medical History:  Past Medical History:  Diagnosis Date  . ADD (attention deficit disorder)   . Arthritis   . GERD (gastroesophageal reflux disease)   . Hypertension     Past Surgical History:  Past Surgical History:  Procedure Laterality Date  . EYE SURGERY     Procedure: EYE SURGERY  . KNEE SURGERY     Procedure: KNEE SURGERY  . OTHER SURGICAL HISTORY     Procedure: OTHER SURGICAL HISTORY (cataract surgery)  . WRIST SURGERY     Procedure: WRIST SURGERY    Medications:  Current Outpatient Medications on File Prior to Visit  Medication Sig Dispense Refill  . atorvastatin (LIPITOR) 20 mg tablet Take 1 tablet (20 mg total) by mouth daily. 90 tablet 3  . dextroamphetamine-amphetamine (ADDERALL) 10 mg tablet Take one pill bid 60 tablet 0  . ergocalciferol (Vitamin D2) 1,250 mcg (50,000 unit) capsule Take 1 capsule (50,000 Units total) by mouth once a week. 12 capsule 3  . escitalopram (LEXAPRO) 5 mg tablet Take one pill at night 90 tablet 3  . immune globulin, human, (Privigen ) infusion Infuse  into a  venous catheter.    . metoprolol  succinate (TOPROL  XL) 25 mg 24 hr tablet Take 1 tablet (25 mg total) by mouth daily. 30 tablet 11  . miscellaneous medical supply (C-Tub) misc 1 each by miscellaneous route once.    . mycophenolate  (CELLCEPT ) 500 mg tablet Take 1,500 mg by mouth 2 (two) times a day.    SABRA omeprazole (PriLOSEC) 20 mg DR capsule Take 20 mg by mouth Once Daily.    . timolol hemihydrate (BetimoL) 0.5 % 1 drop 2 (two) times a day.    . traZODone (DESYREL) 50 mg tablet Take 50 mg by mouth nightly. 90 tablet 3  . valsartan (DIOVAN) 80 mg tablet Take 1 tablet by mouth twice daily 180 tablet 0   No current facility-administered medications on file prior to visit.    Family History:  Family History  Problem Relation Name Age of Onset  . Cancer Father    . Heart attack Father    . COPD Mother    . Blood disease Sister      Social History:    Vitals:  Vitals:   10/10/23 0918  BP: 122/66  Pulse: 77  Temp: 97.3 F (36.3 C)    Physical Exam:  Alert and oriented x 3. Well developed, well nourished. Regular gait.  Right upper extremity: Intact to light touch sensation and capillary refill  in the fingertips.  There is good soft tissue turgor in the fingertips.  She can flex and extend the IP joint of the thumb and can cross her fingers.   No wounds. No swelling, erythema, or ecchymosis. No rashes or lesions.  No signs or symptoms of dystrophy.   Compartments are soft. There is a palpable and tender flexor tendon nodule at the volar aspect of the MP joint of the ring finger. There is demonstrable triggering in the ring finger. Non tender to palpation at the anatomic snuffbox, SL ligament, LT ligament, ulnar styloid, scaphoid tubercle. She is tender to palpation at the [facility] joint of the thumb. Grind test is negative.   Left upper extremity: Intact to light touch sensation and capillary refill in the fingertips.  There is good soft tissue turgor in the fingertips.  She can flex and  extend the IP joint of the thumb and can cross her fingers.   No wounds. No swelling, erythema, or ecchymosis. No rashes or lesions.  No signs or symptoms of dystrophy.   Compartments are soft. Non tender to palpation at the anatomic snuffbox, SL ligament, LT ligament, ulnar styloid, scaphoid tubercle. She is tender to palpation at the [facility] joint of the thumb. Grind test is positive, reproducing her symptoms.   Special Investigations- Review of Diagnostic Tests:   Radiological Studies: I have personally reviewed radiographs taken today and my interpretation is as follows: AP lateral and Roberts views bilateral hands and wrist show no fracture dislocation or radiopaque foreign body with exception of her ring.  There are degenerative changes at the [facility] joints of the thumbs with joint space loss subchondral sclerosis and osteophyte formation greater than 2 mm.  Assessment:    ICD-10-CM   1. Primary osteoarthritis of both first carpometacarpal joints  M18.0 Amb DME    2. Bilateral thumb pain  M79.644 XR Hand Minimum 3 Views Left   M79.645 XR Hand Minimum 3 Views Right    3. Trigger ring finger of right hand  M65.341 XR Hand Minimum 3 Views Right      Plan: I discussed with Almarie JONETTA Cedar the nature of CMC osteoarthritis. We discussed nonoperative treatment with acceptance versus anti-inflammatories on a p.r.n. basis, along with hard and soft CMC splinting. We also discussed the possibility of injection of the [facility] joint if these modalities are ineffective. Surgical options include trapeziectomy with ligament reconstruction.  We will provide her a Comfort Cool CMC splint.  She will use anti-inflammatories with food on a p.r.n. basis. She may also try an over the counter topical anti-inflammatory or CBD oil mixed with Shea butter as needed. I will see her back on a p.r.n. basis if she decides she needs an injection or further care.   She agrees with the plan of care.  Also discussed  the nature of  trigger digits.  We discussed that they will often resolve with injections and 70% of people are better with one injection,  80% are better with two injections, and surgery can be performed for recurrent cases.  She states the trigger is not bothering her very much at this time and she wants to just keep an eye on it.  Follow up: Return if symptoms worsen or fail to improve.  Orders Placed This Encounter  Procedures  . Amb DME  . XR Hand Minimum 3 Views Left  . XR Hand Minimum 3 Views Right

## 2023-12-15 ENCOUNTER — Encounter (HOSPITAL_COMMUNITY): Payer: Self-pay

## 2023-12-18 ENCOUNTER — Encounter (HOSPITAL_COMMUNITY): Payer: Self-pay

## 2023-12-18 ENCOUNTER — Other Ambulatory Visit (HOSPITAL_COMMUNITY): Payer: Self-pay

## 2023-12-18 DIAGNOSIS — C801 Malignant (primary) neoplasm, unspecified: Secondary | ICD-10-CM

## 2023-12-29 ENCOUNTER — Encounter (HOSPITAL_COMMUNITY): Payer: Self-pay

## 2023-12-29 ENCOUNTER — Encounter (HOSPITAL_COMMUNITY)
Admission: RE | Admit: 2023-12-29 | Discharge: 2023-12-29 | Disposition: A | Discharge status: Home / Self Care | Source: Ambulatory Visit

## 2023-12-29 VITALS — Wt 150.0 lb

## 2023-12-29 DIAGNOSIS — M609 Myositis, unspecified: Secondary | ICD-10-CM

## 2023-12-29 DIAGNOSIS — C801 Malignant (primary) neoplasm, unspecified: Secondary | ICD-10-CM

## 2023-12-29 LAB — GLUCOSE, CAPILLARY: Glucose-Capillary: 103 mg/dL — ABNORMAL HIGH (ref 70–99)

## 2023-12-29 MED ORDER — FLUDEOXYGLUCOSE F - 18 (FDG) INJECTION
7.4100 | Freq: Once | INTRAVENOUS | Status: AC
  Administered 2023-12-29: 7.41 via INTRAVENOUS

## 2024-04-03 ENCOUNTER — Other Ambulatory Visit (HOSPITAL_BASED_OUTPATIENT_CLINIC_OR_DEPARTMENT_OTHER): Payer: Self-pay

## 2024-04-03 DIAGNOSIS — M3313 Other dermatomyositis without myopathy: Secondary | ICD-10-CM

## 2024-04-14 ENCOUNTER — Ambulatory Visit (HOSPITAL_BASED_OUTPATIENT_CLINIC_OR_DEPARTMENT_OTHER)
Admission: RE | Admit: 2024-04-14 | Discharge: 2024-04-14 | Disposition: A | Discharge status: Home / Self Care | Source: Ambulatory Visit

## 2024-04-14 DIAGNOSIS — R918 Other nonspecific abnormal finding of lung field: Secondary | ICD-10-CM | POA: Insufficient documentation

## 2024-04-14 DIAGNOSIS — M3313 Other dermatomyositis without myopathy: Secondary | ICD-10-CM | POA: Insufficient documentation

## 2024-04-14 DIAGNOSIS — R590 Localized enlarged lymph nodes: Secondary | ICD-10-CM | POA: Insufficient documentation

## 2024-04-14 DIAGNOSIS — I2510 Atherosclerotic heart disease of native coronary artery without angina pectoris: Secondary | ICD-10-CM | POA: Insufficient documentation

## 2024-04-14 LAB — POCT I-STAT CREATININE: Creatinine, Ser: 0.9 mg/dL (ref 0.44–1.00)

## 2024-04-14 MED ORDER — IOHEXOL 300 MG/ML  SOLN
100.0000 mL | Freq: Once | INTRAMUSCULAR | Status: AC | PRN
  Administered 2024-04-14: 75 mL via INTRAVENOUS

## 2024-04-22 ENCOUNTER — Encounter: Payer: Self-pay | Admitting: Medical Oncology

## 2024-04-23 NOTE — Progress Notes (Signed)
 Rapid Diagnostic [facility] [phone]   [phone]  INITIAL CONSULTATION:  Patient Care Team: Debrah Josette ORN., PA-C as PCP - General (Family Medicine) Golden Forestine BROCKS, RN as Oncology Nurse Navigator (Medical Oncology)  CHIEF COMPLAINTS/PURPOSE OF CONSULTATION:  Pretracheal lymphadenopathy  HISTORY OF PRESENTING ILLNESS:  Jessica Ellis 68 y.o. female with medical history significant for TIF1+ Dermatomyositis, hypertension, hyperlipidemia presents to the rapid diagnostic clinic for evaluation of enlarged pretracheal lymph node. She is accompanied by her husband for this visit.   On review of the previous records, Jessica Ellis underwent PET/CT imaging on 12/29/2023 as recommended by rheumatologist as she participated in a research study where free tumor DNA was detected in her circulation.  PET scan findings included two right sided lung nodules that were measuring up to 10 mm without hypermetabolism and solitary hypermetabolic precarinal lymph node measuring 10 x 13 mm with SUV 6.6. The plan was to monitor with serial CT imaging so she underwent repeat CT chest on 04/14/2024. Findings showed diminished conspicuity of lung nodules with is more consistent with infectious process. The pretracheal/precarinal lymph node was persistent and unchanged in size.    On exam today, Jessica Ellis is doing well without any new or concerning symptoms. She denies any fatigue or changes in her energy levels. Her appetite and weight are unchanged. She denies nausea, vomiting or bowel habit changes. She denies easy bruising or signs of bleeding. She denies any new lumps or bumps. She denies fevers, chills, sweats, shortness of breath, chest pain or cough. She has no other complaints. Rest of the ROS is below.   MEDICAL HISTORY:  Past Medical History:  Diagnosis Date   Hyperlipidemia    Hypertension     SURGICAL HISTORY: Past Surgical History:  Procedure  Laterality Date   KNEE ARTHROSCOPY     WRIST SURGERY      SOCIAL HISTORY: Social History   Socioeconomic History   Marital status: Married    Spouse name: Not on file   Number of children: Not on file   Years of education: Not on file   Highest education level: Not on file  Occupational History   Not on file  Tobacco Use   Smoking status: Former    Current packs/day: 0.00    Average packs/day: 1 pack/day for 40.0 years (40.0 ttl pk-yrs)    Types: Cigarettes    Start date: 12/17/1972    Quit date: 12/17/2012    Years since quitting: 11.3   Smokeless tobacco: Never  Substance and Sexual Activity   Alcohol use: Not Currently   Drug use: Never   Sexual activity: Not on file  Other Topics Concern   Not on file  Social History Narrative   Not on file   Social Drivers of Health   Financial Resource Strain: Not on file  Food Insecurity: Low Risk  (08/23/2023)   Received from [facility]   Hunger Vital Sign    Within the past 12 months, you worried that your food would run out before you got money to buy more: Never true    Within the past 12 months, the food you bought just didn't last and you didn't have money to get more. : Never true  Transportation Needs: No Transportation Needs (08/23/2023)   Received from [facility]    In the past 12 months, has lack of reliable transportation kept you from medical appointments, meetings, work or from getting things needed  for daily living? : No  Physical Activity: Sufficiently Active (08/09/2020)   Received from [facility] visits prior to 12/03/2022.   Exercise Vital Sign    On average, how many days per week do you engage in moderate to strenuous exercise (like a brisk walk)?: 4 days    On average, how many minutes do you engage in exercise at this level?: 80 min  Stress: Stress Concern Present (08/09/2020)   Received from [facility] visits prior to 12/03/2022.   [employer] - Occupational Stress Questionnaire    Feeling of Stress : Rather much  Social Connections: Unknown (08/09/2020)   Received from [facility] visits prior to 12/03/2022.   Social Connection and Isolation Panel    Frequency of Communication with Friends and Family: Not on file    How often do you get together with friends or relatives?: More than three times a week    Attends Religious Services: Not on file    Active Member of Clubs or Organizations: Not on file    Attends Club or Organization Meetings: Not on file    Are you married, widowed, divorced, separated, never married, or living with a partner?: Married  Intimate Partner Violence: Not At Risk (08/09/2020)   Received from [facility] visits prior to 12/03/2022.   Humiliation, Afraid, Rape, and Kick questionnaire    Within the last year, have you been afraid of your partner or ex-partner?: No    Within the last year, have you been humiliated or emotionally abused in other ways by your partner or ex-partner?: No    Within the last year, have you been kicked, hit, slapped, or otherwise physically hurt by your partner or ex-partner?: No    Within the last year, have you been raped or forced to have any kind of sexual activity by your partner or ex-partner?: No    FAMILY HISTORY: Family History  Problem Relation Age of Onset   Bladder Cancer Father    Hodgkin's lymphoma Sister     ALLERGIES:  has no known allergies.  MEDICATIONS:  Current Outpatient Medications  Medication Sig Dispense Refill   amphetamine-dextroamphetamine (ADDERALL) 15 MG tablet Take 15 mg by mouth daily.     atorvastatin (LIPITOR) 20 MG tablet Take 20 mg by mouth every other day.     Cholecalciferol 50 MCG (2000 UT) CAPS      metoprolol succinate (TOPROL-XL) 25 MG 24 hr tablet Take 50 mg by mouth daily.     mycophenolate (CELLCEPT) 500 MG tablet Take 1,000 mg by mouth 2 (two) times daily.  Takes 1000 mg  2 times a day. (Patient taking differently: Take 1,000 mg by mouth 2 (two) times daily. Takes 1500 mg  2 times a day.)     Naproxen 375 MG TBEC Take 375 mg by mouth as needed.     omeprazole (PRILOSEC) 20 MG capsule Take by mouth.     PRIVIGEN 20 GM/200ML SOLN Inject into the vein. Every 6 weeks     timolol (TIMOPTIC) 0.5 % ophthalmic solution 1 drop 2 times daily.     valsartan (DIOVAN) 80 MG tablet Take 80 mg by mouth 2 (two) times daily.     No current facility-administered medications for this visit.    REVIEW OF SYSTEMS:   Constitutional: ( - ) fevers, ( - )  chills , ( - ) night sweats Eyes: ( - )  blurriness of vision, ( - ) double vision, ( - ) watery eyes Ears, nose, mouth, throat, and face: ( - ) mucositis, ( - ) sore throat Respiratory: ( - ) cough, ( - ) dyspnea, ( - ) wheezes Cardiovascular: ( - ) palpitation, ( - ) chest discomfort, ( - ) lower extremity swelling Gastrointestinal:  ( - ) nausea, ( - ) heartburn, ( - ) change in bowel habits Skin: ( - ) abnormal skin rashes Lymphatics: ( - ) new lymphadenopathy, ( - ) easy bruising Neurological: ( - ) numbness, ( - ) tingling, ( - ) new weaknesses Behavioral/Psych: ( - ) mood change, ( - ) new changes  All other systems were reviewed with the patient and are negative.  PHYSICAL EXAMINATION: ECOG PERFORMANCE STATUS: 0 - Asymptomatic  Vitals:   04/24/24 1411  BP: (!) 140/69  Pulse: 77  Resp: 18  Temp: 97.7 F (36.5 C)  SpO2: 96%   Filed Weights   04/24/24 1411  Weight: 149 lb 1.6 oz (67.6 kg)    GENERAL: well appearing female in NAD  SKIN: skin color, texture, turgor are normal, no rashes or significant lesions EYES: conjunctiva are pink and non-injected, sclera clear OROPHARYNX: no exudate, no erythema; lips, buccal mucosa, and tongue normal  NECK: supple, non-tender LYMPH:  no palpable lymphadenopathy in the cervical, axillary or supraclavicular lymph nodes.  LUNGS: clear to auscultation and  percussion with normal breathing effort HEART: regular rate & rhythm and no murmurs and no lower extremity edema ABDOMEN: soft, non-tender, non-distended, normal bowel sounds Musculoskeletal: no cyanosis of digits and no clubbing  PSYCH: alert & oriented x 3, fluent speech NEURO: no focal motor/sensory deficits  LABORATORY DATA:  I have reviewed the data as listed    Latest Ref Rng & Units 09/17/2010    7:22 AM  CBC  Hemoglobin 12.0 - 15.0 g/dL 84.7        Latest Ref Rng & Units 04/14/2024    4:06 PM  CMP  Creatinine 0.44 - 1.00 mg/dL 9.09      RADIOGRAPHIC STUDIES: I have personally reviewed the radiological images as listed and agreed with the findings in the report. CT CHEST W CONTRAST Result Date: 04/16/2024 CLINICAL DATA:  Follow-up abnormal imaging, hypermetabolic precarinal lymph node and new pulmonary nodules, history of dermatomyositis * Tracking Code: BO * EXAM: CT CHEST WITH CONTRAST TECHNIQUE: Multidetector CT imaging of the chest was performed during intravenous contrast administration. RADIATION DOSE REDUCTION: This exam was performed according to the departmental dose-optimization program which includes automated exposure control, adjustment of the mA and/or kV according to patient size and/or use of iterative reconstruction technique. CONTRAST:  75mL OMNIPAQUE  IOHEXOL  300 MG/ML  SOLN COMPARISON:  PET-CT, 12/29/2023 FINDINGS: Cardiovascular: Aortic atherosclerosis. Normal heart size. Scattered left and right coronary artery calcifications. No pericardial effusion. Mediastinum/Nodes: Unchanged prominent pretracheal lymph node measuring up to 1.1 x 0.9 cm (series 2, image 61). Small hiatal hernia. Thyroid  gland, trachea, and esophagus demonstrate no significant findings. Lungs/Pleura: Mild, bland appearing bandlike scarring of the lung bases. No evidence of fibrotic interstitial lung disease. Diminished conspicuity of nodularity in the dependent right lung base, now tiny  clustered and partially calcified nodules (series 3, image 99). Additional clustered centrilobular and tree-in-bud nodularity occasionally seen, for example in the superior segment left lower lobe (series 3, image 62) and in the right lung base (series 3, image 115). No pleural effusion or pneumothorax. Upper Abdomen: No acute abnormality. Musculoskeletal: No chest wall  abnormality. No acute osseous findings. IMPRESSION: 1. Diminished conspicuity of nodularity in the dependent right lung base, now tiny clustered and partially calcified nodules. Additional clustered centrilobular and tree-in-bud nodularity occasionally seen, for example in the superior segment left lower lobe and in the right lung base. 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 2. Unchanged prominent pretracheal lymph node measuring up to 1.1 x 0.9 cm, previously hypermetabolic, of uncertain significance. 3. No evidence of fibrotic interstitial lung disease. 4. Coronary artery disease. Aortic Atherosclerosis (ICD10-I70.0). Electronically Signed   By: Marolyn JONETTA Jaksch M.D.   On: 04/16/2024 16:09    ASSESSMENT & PLAN Jessica Ellis is a 68 y.o. female who presents to the clinic for evaluation of enlarged precarinal lymph node.   #Precarinal Lymphadenopathy: --Differentials include infectious process, inflammatory process, lymphoproliferative disorder or metastatic disease.  --Labs today to check CBC, CMP, LDH, flow cytometry, ESR and CRP levels --Reviewed images with Dr. Shelah who felt 4R lymph node would be reachable by EBUS. Referral for consultation was made today --RTC once workup is complete.   #TIF1+ Dermatomyositis: --Under the care of [facility] --Receives IVIG and MMF 3 mg daily.    #Age appropriate cancer screenings: --Colonoscopy or Cologuard (M/F > 45):  10/2015 normal --Mammography (F > 40):  04/2024 --PAP or HPV (F  > 25):  2022  Orders Placed This Encounter  Procedures   CBC with Differential (Cancer Center Only)    Standing Status:   Future    Expiration Date:   04/24/2025   CMP (Cancer Center only)    Standing Status:   Future    Expiration Date:   04/24/2025   Lactate dehydrogenase (LDH)    Standing Status:   Future    Expiration Date:   04/24/2025   Sedimentation rate    Standing Status:   Future    Expiration Date:   04/24/2025   C-reactive protein    Standing Status:   Future    Expiration Date:   04/24/2025   Flow Cytometry, Peripheral Blood (Oncology)    Standing Status:   Future    Expiration Date:   04/24/2025    All questions were answered. The patient knows to call the clinic with any problems, questions or concerns.  I have spent a total of 60 minutes minutes of face-to-face and non-face-to-face time, preparing to see the patient, obtaining and/or reviewing separately obtained history, performing a medically appropriate examination, counseling and educating the patient, ordering medications/tests/procedures, referring and communicating with other health care professionals, documenting clinical information in the electronic health record, independently interpreting results and communicating results to the patient, and care coordination.   Johnston Police, PA-C Department of Hematology/Oncology [facility] at [facility] Phone: [phone]  Patient was seen with Dr. Federico  I have read the above note and personally examined the patient. I agree with the assessment and plan as noted above.  Briefly Jessica Ellis is a 69 year old female who presents for evaluation of a hypermetabolic paratracheal lymph node.  The patient was undergoing evaluation for lung nodules which are decreasing in conspicuously based on the last CT scan of the chest on 04/14/2024.  The lymph node has remained enlarged at 1.1 x 0.9 cm and is hypermetabolic on PET scan.  At this time we have a  low clinical suspicion that this represents an underlying malignancy, however would recommend consideration of biopsy.  Will refer to  pulmonology for consideration of EBUS with biopsy of the lesion.  Today we will order workup to include CBC, CMP, LDH, and flow cytometry with inflammatory markers.  The patient voiced understanding of our findings and plan moving forward.   Norleen IVAR Kidney, MD Department of Hematology/Oncology [facility] at [facility] Phone: [phone] Pager: [phone] Email: [email or web address]

## 2024-04-24 ENCOUNTER — Encounter: Payer: Self-pay | Admitting: Medical Oncology

## 2024-04-24 ENCOUNTER — Inpatient Hospital Stay: Attending: Physician Assistant | Admitting: Physician Assistant

## 2024-04-24 ENCOUNTER — Other Ambulatory Visit

## 2024-04-24 ENCOUNTER — Inpatient Hospital Stay

## 2024-04-24 ENCOUNTER — Ambulatory Visit: Admitting: Physician Assistant

## 2024-04-24 ENCOUNTER — Encounter: Payer: Self-pay | Admitting: Physician Assistant

## 2024-04-24 VITALS — BP 140/69 | HR 77 | Temp 97.7°F | Resp 18 | Ht 64.0 in | Wt 149.1 lb

## 2024-04-24 DIAGNOSIS — Z87891 Personal history of nicotine dependence: Secondary | ICD-10-CM | POA: Diagnosis not present

## 2024-04-24 DIAGNOSIS — I2510 Atherosclerotic heart disease of native coronary artery without angina pectoris: Secondary | ICD-10-CM | POA: Diagnosis not present

## 2024-04-24 DIAGNOSIS — R591 Generalized enlarged lymph nodes: Secondary | ICD-10-CM | POA: Insufficient documentation

## 2024-04-24 DIAGNOSIS — Z79899 Other long term (current) drug therapy: Secondary | ICD-10-CM | POA: Insufficient documentation

## 2024-04-24 DIAGNOSIS — M3313 Other dermatomyositis without myopathy: Secondary | ICD-10-CM | POA: Insufficient documentation

## 2024-04-24 DIAGNOSIS — E785 Hyperlipidemia, unspecified: Secondary | ICD-10-CM | POA: Insufficient documentation

## 2024-04-24 DIAGNOSIS — I700 Atherosclerosis of aorta: Secondary | ICD-10-CM | POA: Diagnosis not present

## 2024-04-24 DIAGNOSIS — Z79624 Long term (current) use of inhibitors of nucleotide synthesis: Secondary | ICD-10-CM | POA: Diagnosis not present

## 2024-04-24 DIAGNOSIS — J984 Other disorders of lung: Secondary | ICD-10-CM | POA: Insufficient documentation

## 2024-04-24 DIAGNOSIS — R918 Other nonspecific abnormal finding of lung field: Secondary | ICD-10-CM | POA: Insufficient documentation

## 2024-04-24 DIAGNOSIS — I10 Essential (primary) hypertension: Secondary | ICD-10-CM | POA: Diagnosis not present

## 2024-04-24 LAB — CBC WITH DIFFERENTIAL (CANCER CENTER ONLY)
Abs Immature Granulocytes: 0.01 K/uL (ref 0.00–0.07)
Basophils Absolute: 0 K/uL (ref 0.0–0.1)
Basophils Relative: 1 %
Eosinophils Absolute: 0.1 K/uL (ref 0.0–0.5)
Eosinophils Relative: 3 %
HCT: 30.4 % — ABNORMAL LOW (ref 36.0–46.0)
Hemoglobin: 9.8 g/dL — ABNORMAL LOW (ref 12.0–15.0)
Immature Granulocytes: 0 %
Lymphocytes Relative: 24 %
Lymphs Abs: 0.8 K/uL (ref 0.7–4.0)
MCH: 28.5 pg (ref 26.0–34.0)
MCHC: 32.2 g/dL (ref 30.0–36.0)
MCV: 88.4 fL (ref 80.0–100.0)
Monocytes Absolute: 0.3 K/uL (ref 0.1–1.0)
Monocytes Relative: 9 %
Neutro Abs: 2.2 K/uL (ref 1.7–7.7)
Neutrophils Relative %: 63 %
Platelet Count: 167 K/uL (ref 150–400)
RBC: 3.44 MIL/uL — ABNORMAL LOW (ref 3.87–5.11)
RDW: 15.2 % (ref 11.5–15.5)
WBC Count: 3.5 K/uL — ABNORMAL LOW (ref 4.0–10.5)
nRBC: 0 % (ref 0.0–0.2)

## 2024-04-24 LAB — C-REACTIVE PROTEIN: CRP: <0.5 mg/dL (ref <1.0)

## 2024-04-24 LAB — CMP (CANCER CENTER ONLY)
ALT: 16 U/L (ref 0–44)
AST: 23 U/L (ref 15–41)
Albumin: 3.7 g/dL (ref 3.5–5.0)
Alkaline Phosphatase: 98 U/L (ref 38–126)
Anion gap: 5 (ref 5–15)
BUN: 15 mg/dL (ref 8–23)
CO2: 28 mmol/L (ref 22–32)
Calcium: 8.8 mg/dL — ABNORMAL LOW (ref 8.9–10.3)
Chloride: 108 mmol/L (ref 98–111)
Creatinine: 0.66 mg/dL (ref 0.44–1.00)
GFR, Estimated: >60 mL/min (ref >60)
Glucose, Bld: 102 mg/dL — ABNORMAL HIGH (ref 70–99)
Potassium: 3.9 mmol/L (ref 3.5–5.1)
Sodium: 141 mmol/L (ref 135–145)
Total Bilirubin: 0.3 mg/dL (ref 0.0–1.2)
Total Protein: 7.5 g/dL (ref 6.5–8.1)

## 2024-04-24 LAB — SEDIMENTATION RATE: Sed Rate: 25 mm/h — ABNORMAL HIGH (ref 0–22)

## 2024-04-24 LAB — LACTATE DEHYDROGENASE: LDH: 160 U/L (ref 98–192)

## 2024-04-24 NOTE — Progress Notes (Signed)
 Rapid [facility] Clinic  Patient presented to clinic, with spouse, for her scheduled appointment with PA-C Johnston. I introduced myself and provided them with my direct contact information. Patient and spouse were both encouraged to call me with any questions/concerns they may have.  Colene KYM Raider, RN, BSN, [facility] Oncology Nurse Navigator, [facility] Clinic 04/24/2024 3:50 PM

## 2024-04-25 ENCOUNTER — Encounter: Payer: Self-pay | Admitting: Medical Oncology

## 2024-04-30 LAB — SURGICAL PATHOLOGY

## 2024-05-01 ENCOUNTER — Encounter: Payer: Self-pay | Admitting: Medical Oncology

## 2024-05-01 ENCOUNTER — Telehealth: Payer: Self-pay | Admitting: Physician Assistant

## 2024-05-01 ENCOUNTER — Other Ambulatory Visit: Payer: Self-pay | Admitting: Physician Assistant

## 2024-05-01 DIAGNOSIS — D649 Anemia, unspecified: Secondary | ICD-10-CM

## 2024-05-01 LAB — FLOW CYTOMETRY

## 2024-05-01 NOTE — Progress Notes (Signed)
 Orders placed for anemia labs to further evaluate low hemoglobin from recent visit.

## 2024-05-01 NOTE — Telephone Encounter (Signed)
 I notified Jessica Ellis by phone regarding lab results from [facility] visit. CBC is showing hemoglobin of 9.8. Patient denies any history of anemia or active bleeding. Chart review shows labs in 2024 showed normal hemoglobin levels. Per discussion with Dr. Federico will have come in for additional labs to work up new anemia. All of patient's questions were answered and she expressed understanding of the plan provided. She is scheduled for a lab appointment on 05/06/24 and orders have been placed.

## 2024-05-06 ENCOUNTER — Encounter: Payer: Self-pay | Admitting: Acute Care

## 2024-05-06 ENCOUNTER — Ambulatory Visit: Admitting: Acute Care

## 2024-05-06 ENCOUNTER — Encounter: Payer: Self-pay | Admitting: Emergency Medicine

## 2024-05-06 ENCOUNTER — Inpatient Hospital Stay: Attending: Physician Assistant

## 2024-05-06 ENCOUNTER — Telehealth: Payer: Self-pay | Admitting: Acute Care

## 2024-05-06 VITALS — BP 125/58 | HR 90 | Temp 98.0°F | Ht 63.0 in | Wt 148.4 lb

## 2024-05-06 DIAGNOSIS — Z87891 Personal history of nicotine dependence: Secondary | ICD-10-CM

## 2024-05-06 DIAGNOSIS — R591 Generalized enlarged lymph nodes: Secondary | ICD-10-CM | POA: Diagnosis present

## 2024-05-06 DIAGNOSIS — R942 Abnormal results of pulmonary function studies: Secondary | ICD-10-CM | POA: Diagnosis not present

## 2024-05-06 DIAGNOSIS — D649 Anemia, unspecified: Secondary | ICD-10-CM

## 2024-05-06 DIAGNOSIS — D509 Iron deficiency anemia, unspecified: Secondary | ICD-10-CM | POA: Diagnosis not present

## 2024-05-06 DIAGNOSIS — R599 Enlarged lymph nodes, unspecified: Secondary | ICD-10-CM | POA: Diagnosis not present

## 2024-05-06 DIAGNOSIS — Z79899 Other long term (current) drug therapy: Secondary | ICD-10-CM | POA: Insufficient documentation

## 2024-05-06 LAB — CBC WITH DIFFERENTIAL (CANCER CENTER ONLY)
Abs Immature Granulocytes: 0 K/uL (ref 0.00–0.07)
Basophils Absolute: 0 K/uL (ref 0.0–0.1)
Basophils Relative: 0 %
Eosinophils Absolute: 0.1 K/uL (ref 0.0–0.5)
Eosinophils Relative: 2 %
HCT: 31 % — ABNORMAL LOW (ref 36.0–46.0)
Hemoglobin: 10.1 g/dL — ABNORMAL LOW (ref 12.0–15.0)
Immature Granulocytes: 0 %
Lymphocytes Relative: 17 %
Lymphs Abs: 0.8 K/uL (ref 0.7–4.0)
MCH: 28.9 pg (ref 26.0–34.0)
MCHC: 32.6 g/dL (ref 30.0–36.0)
MCV: 88.8 fL (ref 80.0–100.0)
Monocytes Absolute: 0.4 K/uL (ref 0.1–1.0)
Monocytes Relative: 8 %
Neutro Abs: 3.4 K/uL (ref 1.7–7.7)
Neutrophils Relative %: 73 %
Platelet Count: 183 K/uL (ref 150–400)
RBC: 3.49 MIL/uL — ABNORMAL LOW (ref 3.87–5.11)
RDW: 16.6 % — ABNORMAL HIGH (ref 11.5–15.5)
WBC Count: 4.7 K/uL (ref 4.0–10.5)
nRBC: 0 % (ref 0.0–0.2)

## 2024-05-06 LAB — IRON AND IRON BINDING CAPACITY (CC-WL,HP ONLY)
Iron: 142 ug/dL (ref 28–170)
Saturation Ratios: 28 % (ref 10.4–31.8)
TIBC: 501 ug/dL — ABNORMAL HIGH (ref 250–450)
UIBC: 359 ug/dL (ref 148–442)

## 2024-05-06 LAB — FERRITIN: Ferritin: 219 ng/mL (ref 11–307)

## 2024-05-06 LAB — FOLATE: Folate: 11.1 ng/mL (ref >5.9)

## 2024-05-06 LAB — VITAMIN B12: Vitamin B-12: 639 pg/mL (ref 180–914)

## 2024-05-06 NOTE — Telephone Encounter (Signed)
 Letter given by nurse case# [id number] sent to [facility] to get auth

## 2024-05-06 NOTE — H&P (View-Only) (Signed)
 History of Present Illness Jessica Ellis is a 68 y.o. female former smoker referred to Dr. Shelah 04/2024 for consideration of EBUS for further evaluation of an enlarged pre-carinal lymph node.    05/06/2024 Discussed the use of AI scribe software for clinical note transcription with the patient, who gave verbal consent to proceed.  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 She was referred by her doctor at [facility] for evaluation after the lymph node was discovered during a surveillance PET scan.  Jessica Ellis underwent PET/CT imaging on 12/29/2023 as recommended by rheumatologist as she participated in a research study where free tumor DNA was detected in her circulation. PET scan findings included two right sided lung nodules that were measuring up to 10 mm without hypermetabolism and solitary hypermetabolic precarinal lymph node measuring 10 x 13 mm with SUV 6.6. The plan was to monitor with serial CT imaging so she underwent repeat CT chest on 04/14/2024. Findings showed diminished conspicuity of lung nodules with is more consistent with infectious process. The pretracheal/precarinal lymph node was persistent and unchanged in size.   She was been referred to the rapid diagnostic clinic for evaluation of enlarged pretracheal lymph node. She was seen by Johnston Police, PA-C, who saw her in conjunction with Dr. Federico. He felt at this time there is a low clinical suspicion that this represents an underlying malignancy, however recommended consideration of biopsy, and referred to pulmonology for consideration of EBUS with biopsy of the lesion. Pt. Was in agreement with the plan.She would like to know what this is.   We discussed the risks and benefits of bronchoscopy with EBUS including definitive diagnosis as  benefit, and bleeding, infection, pneumothorax and adverse reaction to anesthesia . She and her husband want to proceed with the procedure.    She has dermatomyositis, diagnosed in 2022, confirmed by skin biopsy, with symptoms including rash and possible muscle weakness. Her treatment includes Privigen  infusions every ten weeks, initially every four weeks, administered at home over four days, each session lasting four hours.  She does not use blood thinners, including aspirin, and has no history of sleep apnea or diabetes. She has a latex allergy and takes Tylenol  as needed.     Test Results: CT Chest 04/14/2024 Diminished conspicuity of nodularity in the dependent right lung base, now tiny clustered and partially calcified nodules. Additional clustered centrilobular and tree-in-bud nodularity occasionally seen, for example in the superior segment left lower lobe and in the right lung base. 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 2. Unchanged prominent pretracheal lymph node measuring up to 1.1 x 0.9 cm, previously hypermetabolic, of uncertain significance. 3. No evidence of fibrotic interstitial lung disease. 4. Coronary artery disease.     PET scan 12/2023 Solitary hypermetabolic precarinal lymph node which is borderline size. This would have a differential. As this is isolated, could consider short follow-up in 3 months with CT. More aggressive approach could be considered as clinically appropriate.   Two right-sided lung nodules. Largest measures 10 mm and is somewhat ill-defined. These are not hypermetabolic but are new from 2023. This also could be assessed on short follow-up in 3 months.   Slightly nodular contour to liver. Splenic enlargement. Trace free fluid. Please correlate with history.  Small hiatal hernia.  Colonic diverticula.     Latest Ref Rng & Units 04/24/2024     3:28 PM 09/17/2010    7:22 AM  CBC  WBC 4.0 - 10.5 K/uL 3.5    Hemoglobin 12.0 - 15.0 g/dL 9.8  84.7   Hematocrit 36.0 - 46.0 % 30.4    Platelets 150 - 400 K/uL 167         Latest Ref Rng & Units 04/24/2024    3:28 PM 04/14/2024    4:06 PM  BMP  Glucose 70 - 99 mg/dL 897    BUN 8 - 23 mg/dL 15    Creatinine 9.55 - 1.00 mg/dL 9.33  9.09   Sodium 864 - 145 mmol/L 141    Potassium 3.5 - 5.1 mmol/L 3.9    Chloride 98 - 111 mmol/L 108    CO2 22 - 32 mmol/L 28    Calcium 8.9 - 10.3 mg/dL 8.8      BNP No results found for: BNP  ProBNP No results found for: PROBNP  PFT    Component Value Date/Time   FEV1PRE 2.12 03/18/2021 1303   FEV1POST 2.15 03/18/2021 1303   FVCPRE 2.86 03/18/2021 1303   FVCPOST 2.88 03/18/2021 1303   TLC 4.93 03/18/2021 1303   DLCOUNC 20.55 03/18/2021 1303   PREFEV1FVCRT 74 03/18/2021 1303   PSTFEV1FVCRT 75 03/18/2021 1303    CT CHEST W CONTRAST Result Date: 04/16/2024 CLINICAL DATA:  Follow-up abnormal imaging, hypermetabolic precarinal lymph node and new pulmonary nodules, history of dermatomyositis * Tracking Code: BO * EXAM: CT CHEST WITH CONTRAST TECHNIQUE: Multidetector CT imaging of the chest was performed during intravenous contrast administration. RADIATION DOSE REDUCTION: This exam was performed according to the departmental dose-optimization program which includes automated exposure control, adjustment of the mA and/or kV according to patient size and/or use of iterative reconstruction technique. CONTRAST:  75mL OMNIPAQUE  IOHEXOL  300 MG/ML  SOLN COMPARISON:  PET-CT, 12/29/2023 FINDINGS: Cardiovascular: Aortic atherosclerosis. Normal heart size. Scattered left and right coronary artery calcifications. No pericardial effusion. Mediastinum/Nodes: Unchanged prominent pretracheal lymph node measuring up to 1.1 x 0.9 cm (series 2, image 61). Small hiatal hernia. Thyroid  gland, trachea, and esophagus demonstrate no significant findings. Lungs/Pleura:  Mild, bland appearing bandlike scarring of the lung bases. No evidence of fibrotic interstitial lung disease. Diminished conspicuity of nodularity in the dependent right lung base, now tiny clustered and partially calcified nodules (series 3, image 99). Additional clustered centrilobular and tree-in-bud nodularity occasionally seen, for example in the superior segment left lower lobe (series 3, image 62) and in the right lung base (series 3, image 115). No pleural effusion or pneumothorax. Upper Abdomen: No acute abnormality. Musculoskeletal: No chest wall abnormality. No acute osseous findings. IMPRESSION: 1. Diminished conspicuity of nodularity in the dependent right lung base, now tiny clustered and partially calcified nodules. Additional clustered centrilobular and tree-in-bud nodularity occasionally seen, for example in the superior segment left lower lobe and in the right lung base. 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 2. Unchanged prominent pretracheal lymph node measuring up to 1.1 x 0.9 cm, previously hypermetabolic, of uncertain significance. 3. No evidence of fibrotic interstitial lung disease. 4. Coronary artery disease. Aortic Atherosclerosis (ICD10-I70.0). Electronically Signed   By: Marolyn JONETTA Jaksch M.D.   On: 04/16/2024 16:09     Past medical hx Past Medical History:  Diagnosis Date   Dermatomyositis (HCC)    anti TIF-y   Hyperlipidemia  Hypertension      Social History   Tobacco Use   Smoking status: Former    Current packs/day: 0.00    Average packs/day: 1 pack/day for 40.0 years (40.0 ttl pk-yrs)    Types: Cigarettes    Start date: 12/17/1972    Quit date: 12/17/2012    Years since quitting: 11.3   Smokeless tobacco: Never  Substance Use Topics   Alcohol use: Not Currently   Drug use: Never    Ms.Reinertsen reports that she quit smoking about 11 years ago. Her smoking use included  cigarettes. She started smoking about 51 years ago. She has a 40 pack-year smoking history. She has never used smokeless tobacco. She reports that she does not currently use alcohol. She reports that she does not use drugs.  Tobacco Cessation: Counseling given: Not Answered Former smoker with a 40 pack year smoking  Past surgical hx, Family hx, Social hx all reviewed.  Current Outpatient Medications on File Prior to Visit  Medication Sig   amphetamine-dextroamphetamine (ADDERALL) 15 MG tablet Take 15 mg by mouth daily.   atorvastatin (LIPITOR) 20 MG tablet Take 20 mg by mouth every other day.   Cholecalciferol 50 MCG (2000 UT) CAPS    metoprolol  succinate (TOPROL -XL) 25 MG 24 hr tablet Take 50 mg by mouth daily.   mycophenolate  (CELLCEPT ) 500 MG tablet Take 1,000 mg by mouth 2 (two) times daily. Takes 1000 mg  2 times a day. (Patient taking differently: Take 1,000 mg by mouth 2 (two) times daily. Takes 1500 mg  2 times a day.)   Naproxen 375 MG TBEC Take 375 mg by mouth as needed.   omeprazole (PRILOSEC) 20 MG capsule Take by mouth.   PRIVIGEN  20 GM/200ML SOLN Inject into the vein. Every 6 weeks   timolol (TIMOPTIC) 0.5 % ophthalmic solution 1 drop 2 times daily.   valsartan (DIOVAN) 80 MG tablet Take 80 mg by mouth 2 (two) times daily.   No current facility-administered medications on file prior to visit.     No Known Allergies  Review Of Systems:  Constitutional:   No  weight loss, night sweats,  Fevers, chills, fatigue, or  lassitude.  HEENT:   No headaches,  Difficulty swallowing,  Tooth/dental problems, or  Sore throat,                No sneezing, itching, ear ache, nasal congestion, post nasal drip,   CV:  No chest pain,  Orthopnea, PND, swelling in lower extremities, anasarca, dizziness, palpitations, syncope.   GI  No heartburn, indigestion, abdominal pain, nausea, vomiting, diarrhea, change in bowel habits, loss of appetite, bloody stools.   Resp: No shortness of breath  with exertion or at rest.  No excess mucus, no productive cough,  No non-productive cough,  No coughing up of blood.  No change in color of mucus.  No wheezing.  No chest wall deformity  Skin: no rash or lesions.  GU: no dysuria, change in color of urine, no urgency or frequency.  No flank pain, no hematuria   MS:  No joint pain or swelling.  No decreased range of motion.  No back pain.  Psych:  No change in mood or affect. No depression or anxiety.  No memory loss.   Vital Signs BP (!) 125/58   Pulse 90   Temp 98 F (36.7 C) (Temporal)   Ht 5' 3 (1.6 m)   Wt 148 lb 6.4 oz (67.3 kg)   SpO2 95%  BMI 26.29 kg/m    Physical Exam:  General- No distress,  A&Ox3, pleasant ENT: No sinus tenderness, TM clear, pale nasal mucosa, no oral exudate,no post nasal drip, no LAN Cardiac: S1, S2, regular rate and rhythm, no murmur Chest: No wheeze/ rales/ dullness; no accessory muscle use, no nasal flaring, no sternal retractions Abd.: Soft Non-tender, ND, BS +, Body mass index is 26.29 kg/m.  Ext: No clubbing cyanosis, edema, no obvious deformities Neuro:  normal strength, MAE x 4, A&O x 3, appropriate Skin: No rashes, warm and dry, no obvious skin lesions  Psych: normal mood and behavior    Assessment & Plan  Mediastinal lymph node of uncertain significance Former smoker  Solid hypermetabolic precoronal lymph node stable in size with low suspicion of malignancy. - Schedule biopsy for May 14, 2024, at [facility]. - Ensure NPO after midnight before procedure. - Perform lymph node biopsy under general anesthesia using EBUS - Discussed risks: bleeding, infection, pneumothorax. - Chest tube placement if pneumothorax occurs. - Coordinate follow-up to review biopsy results after May 24, 2024. We have scheduled you for a biopsy of the pre carinal node. I have placed an order for a bronchoscopy with biopsies for 05/14/2024.  We have discussed the procedure in detail.  We  have reviewed the risks and benefits of the procedure. These include bleeding, infection, puncture of the lung, and adverse reaction to anesthesia. You have agreed to proceed with biopsy to evaluate the pre carinal lymph node Your procedure will be done by Dr. Lamar Chris. You will receive a letter today with date time and information pertaining to the procedure. You will need someone to drive you to the procedure, stay with you during the procedure, and stay with you after the procedure. You will also need someone to stay with you for 24 hours after anesthesia to ensure you have cleared and are doing well. You will follow-up with me 1 week after the procedure to review the results and to ensure you are doing well. Call if you need us  prior to the procedure or if you have any questions at all. Please contact office for sooner follow up if symptoms do not improve or worsen or seek emergency care    I spent 40 minutes dedicated to the care of this patient on the date of this encounter to include pre-visit review of records, face-to-face time with the patient discussing conditions above, post visit ordering of testing, clinical documentation with the electronic health record, making appropriate referrals as documented, and communicating necessary information to the patient's healthcare team.     Lauraine JULIANNA Lites, NP 05/06/2024  11:18 AM

## 2024-05-06 NOTE — Patient Instructions (Addendum)
 It is good to see you today. We have scheduled you for a biopsy of the pre carinal node. I have placed an order for a bronchoscopy with biopsies for 05/14/2024.  We have discussed the procedure in detail.  We have reviewed the risks and benefits of the procedure. These include bleeding, infection, puncture of the lung, and adverse reaction to anesthesia. You have agreed to proceed with biopsy to evaluate the pre carinal lymph node Your procedure will be done by Dr. Lamar Chris. You will receive a letter today with date time and information pertaining to the procedure. You will need someone to drive you to the procedure, stay with you during the procedure, and stay with you after the procedure. You will also need someone to stay with you for 24 hours after anesthesia to ensure you have cleared and are doing well. You will follow-up with me 1 week after the procedure to review the results and to ensure you are doing well. Call if you need us  prior to the procedure or if you have any questions at all. Please contact office for sooner follow up if symptoms do not improve or worsen or seek emergency care

## 2024-05-06 NOTE — Progress Notes (Signed)
 History of Present Illness Jessica Ellis is a 68 y.o. female former smoker referred to Dr. Shelah 04/2024 for consideration of EBUS for further evaluation of an enlarged pre-carinal lymph node.    05/06/2024 Discussed the use of AI scribe software for clinical note transcription with the patient, who gave verbal consent to proceed.  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 She was referred by her doctor at [facility] for evaluation after the lymph node was discovered during a surveillance PET scan.  Jessica Ellis underwent PET/CT imaging on 12/29/2023 as recommended by rheumatologist as she participated in a research study where free tumor DNA was detected in her circulation. PET scan findings included two right sided lung nodules that were measuring up to 10 mm without hypermetabolism and solitary hypermetabolic precarinal lymph node measuring 10 x 13 mm with SUV 6.6. The plan was to monitor with serial CT imaging so she underwent repeat CT chest on 04/14/2024. Findings showed diminished conspicuity of lung nodules with is more consistent with infectious process. The pretracheal/precarinal lymph node was persistent and unchanged in size.   She was been referred to the rapid diagnostic clinic for evaluation of enlarged pretracheal lymph node. She was seen by Johnston Police, PA-C, who saw her in conjunction with Dr. Federico. He felt at this time there is a low clinical suspicion that this represents an underlying malignancy, however recommended consideration of biopsy, and referred to pulmonology for consideration of EBUS with biopsy of the lesion. Pt. Was in agreement with the plan.She would like to know what this is.   We discussed the risks and benefits of bronchoscopy with EBUS including definitive diagnosis as  benefit, and bleeding, infection, pneumothorax and adverse reaction to anesthesia . She and her husband want to proceed with the procedure.    She has dermatomyositis, diagnosed in 2022, confirmed by skin biopsy, with symptoms including rash and possible muscle weakness. Her treatment includes Privigen infusions every ten weeks, initially every four weeks, administered at home over four days, each session lasting four hours.  She does not use blood thinners, including aspirin, and has no history of sleep apnea or diabetes. She has a latex allergy and takes Tylenol as needed.     Test Results: CT Chest 04/14/2024 Diminished conspicuity of nodularity in the dependent right lung base, now tiny clustered and partially calcified nodules. Additional clustered centrilobular and tree-in-bud nodularity occasionally seen, for example in the superior segment left lower lobe and in the right lung base. 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 2. Unchanged prominent pretracheal lymph node measuring up to 1.1 x 0.9 cm, previously hypermetabolic, of uncertain significance. 3. No evidence of fibrotic interstitial lung disease. 4. Coronary artery disease.     PET scan 12/2023 Solitary hypermetabolic precarinal lymph node which is borderline size. This would have a differential. As this is isolated, could consider short follow-up in 3 months with CT. More aggressive approach could be considered as clinically appropriate.   Two right-sided lung nodules. Largest measures 10 mm and is somewhat ill-defined. These are not hypermetabolic but are new from 2023. This also could be assessed on short follow-up in 3 months.   Slightly nodular contour to liver. Splenic enlargement. Trace free fluid. Please correlate with history.  Small hiatal hernia.  Colonic diverticula.     Latest Ref Rng & Units 04/24/2024     3:28 PM 09/17/2010    7:22 AM  CBC  WBC 4.0 - 10.5 K/uL 3.5    Hemoglobin 12.0 - 15.0 g/dL 9.8  84.7   Hematocrit 36.0 - 46.0 % 30.4    Platelets 150 - 400 K/uL 167         Latest Ref Rng & Units 04/24/2024    3:28 PM 04/14/2024    4:06 PM  BMP  Glucose 70 - 99 mg/dL 897    BUN 8 - 23 mg/dL 15    Creatinine 9.55 - 1.00 mg/dL 9.33  9.09   Sodium 864 - 145 mmol/L 141    Potassium 3.5 - 5.1 mmol/L 3.9    Chloride 98 - 111 mmol/L 108    CO2 22 - 32 mmol/L 28    Calcium 8.9 - 10.3 mg/dL 8.8      BNP No results found for: BNP  ProBNP No results found for: PROBNP  PFT    Component Value Date/Time   FEV1PRE 2.12 03/18/2021 1303   FEV1POST 2.15 03/18/2021 1303   FVCPRE 2.86 03/18/2021 1303   FVCPOST 2.88 03/18/2021 1303   TLC 4.93 03/18/2021 1303   DLCOUNC 20.55 03/18/2021 1303   PREFEV1FVCRT 74 03/18/2021 1303   PSTFEV1FVCRT 75 03/18/2021 1303    CT CHEST W CONTRAST Result Date: 04/16/2024 CLINICAL DATA:  Follow-up abnormal imaging, hypermetabolic precarinal lymph node and new pulmonary nodules, history of dermatomyositis * Tracking Code: BO * EXAM: CT CHEST WITH CONTRAST TECHNIQUE: Multidetector CT imaging of the chest was performed during intravenous contrast administration. RADIATION DOSE REDUCTION: This exam was performed according to the departmental dose-optimization program which includes automated exposure control, adjustment of the mA and/or kV according to patient size and/or use of iterative reconstruction technique. CONTRAST:  75mL OMNIPAQUE  IOHEXOL  300 MG/ML  SOLN COMPARISON:  PET-CT, 12/29/2023 FINDINGS: Cardiovascular: Aortic atherosclerosis. Normal heart size. Scattered left and right coronary artery calcifications. No pericardial effusion. Mediastinum/Nodes: Unchanged prominent pretracheal lymph node measuring up to 1.1 x 0.9 cm (series 2, image 61). Small hiatal hernia. Thyroid  gland, trachea, and esophagus demonstrate no significant findings. Lungs/Pleura:  Mild, bland appearing bandlike scarring of the lung bases. No evidence of fibrotic interstitial lung disease. Diminished conspicuity of nodularity in the dependent right lung base, now tiny clustered and partially calcified nodules (series 3, image 99). Additional clustered centrilobular and tree-in-bud nodularity occasionally seen, for example in the superior segment left lower lobe (series 3, image 62) and in the right lung base (series 3, image 115). No pleural effusion or pneumothorax. Upper Abdomen: No acute abnormality. Musculoskeletal: No chest wall abnormality. No acute osseous findings. IMPRESSION: 1. Diminished conspicuity of nodularity in the dependent right lung base, now tiny clustered and partially calcified nodules. Additional clustered centrilobular and tree-in-bud nodularity occasionally seen, for example in the superior segment left lower lobe and in the right lung base. 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 2. Unchanged prominent pretracheal lymph node measuring up to 1.1 x 0.9 cm, previously hypermetabolic, of uncertain significance. 3. No evidence of fibrotic interstitial lung disease. 4. Coronary artery disease. Aortic Atherosclerosis (ICD10-I70.0). Electronically Signed   By: Marolyn JONETTA Jaksch M.D.   On: 04/16/2024 16:09     Past medical hx Past Medical History:  Diagnosis Date   Dermatomyositis (HCC)    anti TIF-y   Hyperlipidemia  Hypertension      Social History   Tobacco Use   Smoking status: Former    Current packs/day: 0.00    Average packs/day: 1 pack/day for 40.0 years (40.0 ttl pk-yrs)    Types: Cigarettes    Start date: 12/17/1972    Quit date: 12/17/2012    Years since quitting: 11.3   Smokeless tobacco: Never  Substance Use Topics   Alcohol use: Not Currently   Drug use: Never    Ms.Mcswain reports that she quit smoking about 11 years ago. Her smoking use included  cigarettes. She started smoking about 51 years ago. She has a 40 pack-year smoking history. She has never used smokeless tobacco. She reports that she does not currently use alcohol. She reports that she does not use drugs.  Tobacco Cessation: Counseling given: Not Answered Former smoker with a 40 pack year smoking  Past surgical hx, Family hx, Social hx all reviewed.  Current Outpatient Medications on File Prior to Visit  Medication Sig   amphetamine-dextroamphetamine (ADDERALL) 15 MG tablet Take 15 mg by mouth daily.   atorvastatin (LIPITOR) 20 MG tablet Take 20 mg by mouth every other day.   Cholecalciferol 50 MCG (2000 UT) CAPS    metoprolol  succinate (TOPROL -XL) 25 MG 24 hr tablet Take 50 mg by mouth daily.   mycophenolate  (CELLCEPT ) 500 MG tablet Take 1,000 mg by mouth 2 (two) times daily. Takes 1000 mg  2 times a day. (Patient taking differently: Take 1,000 mg by mouth 2 (two) times daily. Takes 1500 mg  2 times a day.)   Naproxen 375 MG TBEC Take 375 mg by mouth as needed.   omeprazole (PRILOSEC) 20 MG capsule Take by mouth.   PRIVIGEN  20 GM/200ML SOLN Inject into the vein. Every 6 weeks   timolol (TIMOPTIC) 0.5 % ophthalmic solution 1 drop 2 times daily.   valsartan (DIOVAN) 80 MG tablet Take 80 mg by mouth 2 (two) times daily.   No current facility-administered medications on file prior to visit.     No Known Allergies  Review Of Systems:  Constitutional:   No  weight loss, night sweats,  Fevers, chills, fatigue, or  lassitude.  HEENT:   No headaches,  Difficulty swallowing,  Tooth/dental problems, or  Sore throat,                No sneezing, itching, ear ache, nasal congestion, post nasal drip,   CV:  No chest pain,  Orthopnea, PND, swelling in lower extremities, anasarca, dizziness, palpitations, syncope.   GI  No heartburn, indigestion, abdominal pain, nausea, vomiting, diarrhea, change in bowel habits, loss of appetite, bloody stools.   Resp: No shortness of breath  with exertion or at rest.  No excess mucus, no productive cough,  No non-productive cough,  No coughing up of blood.  No change in color of mucus.  No wheezing.  No chest wall deformity  Skin: no rash or lesions.  GU: no dysuria, change in color of urine, no urgency or frequency.  No flank pain, no hematuria   MS:  No joint pain or swelling.  No decreased range of motion.  No back pain.  Psych:  No change in mood or affect. No depression or anxiety.  No memory loss.   Vital Signs BP (!) 125/58   Pulse 90   Temp 98 F (36.7 C) (Temporal)   Ht 5' 3 (1.6 m)   Wt 148 lb 6.4 oz (67.3 kg)   SpO2 95%  BMI 26.29 kg/m    Physical Exam:  General- No distress,  A&Ox3, pleasant ENT: No sinus tenderness, TM clear, pale nasal mucosa, no oral exudate,no post nasal drip, no LAN Cardiac: S1, S2, regular rate and rhythm, no murmur Chest: No wheeze/ rales/ dullness; no accessory muscle use, no nasal flaring, no sternal retractions Abd.: Soft Non-tender, ND, BS +, Body mass index is 26.29 kg/m.  Ext: No clubbing cyanosis, edema, no obvious deformities Neuro:  normal strength, MAE x 4, A&O x 3, appropriate Skin: No rashes, warm and dry, no obvious skin lesions  Psych: normal mood and behavior    Assessment & Plan  Mediastinal lymph node of uncertain significance Former smoker  Solid hypermetabolic precoronal lymph node stable in size with low suspicion of malignancy. - Schedule biopsy for May 14, 2024, at [facility]. - Ensure NPO after midnight before procedure. - Perform lymph node biopsy under general anesthesia using EBUS - Discussed risks: bleeding, infection, pneumothorax. - Chest tube placement if pneumothorax occurs. - Coordinate follow-up to review biopsy results after May 24, 2024. We have scheduled you for a biopsy of the pre carinal node. I have placed an order for a bronchoscopy with biopsies for 05/14/2024.  We have discussed the procedure in detail.  We  have reviewed the risks and benefits of the procedure. These include bleeding, infection, puncture of the lung, and adverse reaction to anesthesia. You have agreed to proceed with biopsy to evaluate the pre carinal lymph node Your procedure will be done by Dr. Lamar Chris. You will receive a letter today with date time and information pertaining to the procedure. You will need someone to drive you to the procedure, stay with you during the procedure, and stay with you after the procedure. You will also need someone to stay with you for 24 hours after anesthesia to ensure you have cleared and are doing well. You will follow-up with me 1 week after the procedure to review the results and to ensure you are doing well. Call if you need us  prior to the procedure or if you have any questions at all. Please contact office for sooner follow up if symptoms do not improve or worsen or seek emergency care    I spent 40 minutes dedicated to the care of this patient on the date of this encounter to include pre-visit review of records, face-to-face time with the patient discussing conditions above, post visit ordering of testing, clinical documentation with the electronic health record, making appropriate referrals as documented, and communicating necessary information to the patient's healthcare team.     Lauraine JULIANNA Lites, NP 05/06/2024  11:18 AM

## 2024-05-06 NOTE — Telephone Encounter (Signed)
 Please schedule the following:  Provider performing procedure: Byrum Diagnosis:  Pre tracheal lymph node + on PET Which side if for nodule / mass?  Mediastinal Procedure: Lung Nodule  Has patient been spoken to by Provider and given informed consent?  Anesthesia: General Do you need Fluro?  Yes Duration of procedure:  1.5 hours Date: 05/14/2024 Alternate Date: opening  Time: Want the 7 am slot Location: [facility] Does patient have OSA? No DM? No Or Latex allergy?  No Medication Restriction/ Anticoagulate/Antiplatelet:  None Pre-op Labs Ordered:determined by Anesthesia Imaging request:  CT chest with contrast 04/14/2024  (If, SuperDimension CT Chest, please have STAT courier sent to ENDO)

## 2024-05-07 NOTE — Telephone Encounter (Signed)
**Note De-identified  [place] Obfuscation** Please advise 

## 2024-05-07 NOTE — Progress Notes (Signed)
 I agree with the plans as outlined above.   Lamar Chris, MD, PhD 05/07/2024, 7:59 AM [facility] [phone] or if no answer before 7:00PM call [phone] For any issues after 7:00PM please call eLink [phone]

## 2024-05-08 ENCOUNTER — Encounter: Payer: Self-pay | Admitting: Oncology

## 2024-05-09 ENCOUNTER — Encounter: Payer: Self-pay | Admitting: Physician Assistant

## 2024-05-10 ENCOUNTER — Other Ambulatory Visit: Payer: Self-pay

## 2024-05-10 ENCOUNTER — Encounter (HOSPITAL_COMMUNITY): Payer: Self-pay | Admitting: Emergency Medicine

## 2024-05-10 NOTE — Progress Notes (Signed)
 PCP - Josette Aho, PA-C Cardiologist - Dr. Debby Manner  PPM/ICD - denies   Chest x-ray - 02/22/23- CE EKG - 05/02/24- CE- tracing requested Stress Test - 01/04/22 ECHO - 12/02/21 Cardiac Cath - denies  CPAP - denies  DM- denies  ASA/Blood Thinner Instructions: n/a   ERAS Protcol - no, NPO  COVID TEST- n/a  Anesthesia review: yes, cardiac hx  Patient verbally denies any shortness of breath, fever, cough and chest pain during phone call      Questions were answered. Patient verbalized understanding of instructions.

## 2024-05-10 NOTE — Progress Notes (Signed)
 Anesthesia Chart Review: Same day workup  68 year old female follows with cardiologist Dr. Raylene at [facility] for history of dermatomyositis, dyslipidemia, and SVT.  Echo 01/2022 demonstrated normal LV systolic function, no significant valvular abnormalities.  Exercise treadmill stress test 01/2022 was nonischemic.  Zio patch 01/2022 demonstrated 148 episodes of SVT.  Last seen in follow-up 05/02/2024 and noted to be doing well on low-dose beta-blocker.  No changes to management, 18-month follow-up recommended.  She is followed at [facility] for her history of dermatomyositis.  She is maintained on mycophenolate 1.5 g twice daily and IVIG every 10 weeks.  She was last seen in follow-up by Dr. Olam Bruckner on 12/07/2023.  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 There was no evidence of ILD on HRCT and per her report recent PFTs were normal at her last visit. Cardiac MRI over-read did not note any significant pulmonary involvement. Her strength continues to be normal, which is much improved compared to when she first presented. She only has minimal cutaneous activity, and CDASI has improved from 35 -->26--> 14 --> 3 --> 1.  She participates in a research study that detected free tumor DNA in her circulation when she therefore underwent a PET scan which showed an enlarged precarinal lymph node.  She was referred to pulmonology for further evaluation.    CMP 04/24/2024 reviewed, unremarkable.  CBC 05/06/2024 with mild anemia, hemoglobin 10.1, otherwise unremarkable.  EKG 05/02/2024 (Care Everywhere): Sinus rhythm with PVCs.  Rate 85.  TTE 01/04/2022 (Care Everywhere): SUMMARY  Mild left ventricular hypertrophy  Left ventricular systolic function is normal.  LV ejection fraction = 60-65%.  There is aortic valve sclerosis.  There is no aortic stenosis.  There is no comparison study available   Exercise treadmill stress test 01/04/2022 (Care  Everywhere): Conclusion   1.  Exercise capacity was fair for age exercised for 5 minutes and 1 second of the Bruce protocol achieving 7 METS.  2.  The stress test was negative for ischemia no diagnostic EKG changes at target heart rate.  3.  Hypertensive response to exercise with peak blood pressure 210-90     Lynwood Geofm RIGGERS [facility] Stay [facility]/Anesthesiology Phone [phone] 05/10/2024 11:42 AM

## 2024-05-10 NOTE — Pre-Procedure Instructions (Signed)
-------------    SDW INSTRUCTIONS given:  Your procedure is scheduled on 8/12.  Report to [facility] Main Entrance A at 05:30 A.M., and check in at the Admitting office.  Any questions or running late day of surgery: call [phone]    Remember:  Do not eat or drink after midnight the night before your surgery    Take these medicines the morning of surgery with A SIP OF WATER  atorvastatin, metoprolol, mycophenolate, omeprazole. timolol    As of today, STOP taking any Aspirin (unless otherwise instructed by your surgeon) Aleve, Naproxen, Ibuprofen, Motrin, Advil, Goody's, BC's, all herbal medications, fish oil, and all vitamins.   Do NOT Smoke (Tobacco/Vaping) 24 hours prior to your procedure  If you use a CPAP at night, you may bring all equipment for your overnight stay.     You will be asked to remove any contacts, glasses, piercing's, hearing aid's, dentures/partials prior to surgery. Please bring cases for these items if needed.     Patients discharged the day of surgery will not be allowed to drive home, and someone needs to stay with them for 24 hours.  SURGICAL WAITING ROOM VISITATION Patients may have no more than 2 support people in the waiting area - these visitors may rotate.   Pre-op nurse will coordinate an appropriate time for 1 ADULT support person, who may not rotate, to accompany patient in pre-op.  Children under the age of 63 must have an adult with them who is not the patient and must remain in the main waiting area with an adult.  If the patient needs to stay at the hospital during part of their recovery, the visitor guidelines for inpatient rooms apply.  Please refer to the [facility] website for the visitor guidelines for any additional information.   Special instructions:   [facility]- Preparing For Surgery   Please follow these instructions carefully.   Shower the NIGHT BEFORE SURGERY and the MORNING OF SURGERY with DIAL Soap.   Pat yourself  dry with a CLEAN TOWEL.  Wear CLEAN PAJAMAS to bed the night before surgery  Place CLEAN SHEETS on your bed the night of your first shower and DO NOT SLEEP WITH PETS.   Additional instructions for the day of surgery: DO NOT APPLY any lotions, deodorants, cologne, or perfumes.   Do not wear jewelry or makeup Do not wear nail polish, gel polish, artificial nails, or any other type of covering on natural nails (fingers and toes) Do not bring valuables to the hospital. [facility] is not responsible for valuables/personal belongings. Put on clean/comfortable clothes.  Please brush your teeth.  Ask your nurse before applying any prescription medications to the skin.

## 2024-05-10 NOTE — Anesthesia Preprocedure Evaluation (Addendum)
 Anesthesia Evaluation  Patient identified by MRN, date of birth, ID band Patient awake    Reviewed: Allergy & Precautions, NPO status , Patient's Chart, lab work & pertinent test results  History of Anesthesia Complications Negative for: history of anesthetic complications  Airway Mallampati: II  TM Distance: >3 FB Neck ROM: Full    Dental no notable dental hx. (+) Teeth Intact   Pulmonary neg pulmonary ROS, neg sleep apnea, neg COPD, Patient abstained from smoking.Not current smoker, former smoker   Pulmonary exam normal breath sounds clear to auscultation       Cardiovascular Exercise Tolerance: Good METShypertension, Pt. on medications (-) CAD and (-) Past MI + dysrhythmias Supra Ventricular Tachycardia  Rhythm:Regular Rate:Normal - Systolic murmurs PAT note by Lynwood Hope, PA-C: 68 year old female follows with cardiologist Dr. Raylene at [facility] for history of dermatomyositis, dyslipidemia, and SVT.  Echo 01/2022 demonstrated normal LV systolic function, no significant valvular abnormalities.  Exercise treadmill stress test 01/2022 was nonischemic.  Zio patch 01/2022 demonstrated 148 episodes of SVT.  Last seen in follow-up 05/02/2024 and noted to be doing well on low-dose beta-blocker.  No changes to management, 75-month follow-up recommended.  She is followed at [facility] for her history of dermatomyositis.  She is maintained on mycophenolate  1.5 g twice daily and IVIG every 10 weeks.  She was last seen in follow-up by Dr. Olam Bruckner on 12/07/2023.  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 There was no evidence of ILD on HRCT and per her report recent PFTs were normal at her last visit. Cardiac MRI over-read did not note any significant pulmonary involvement. Her strength continues to be normal, which is much improved compared to when she first presented. She only has  minimal cutaneous activity, and CDASI has improved from 35 -->26--> 14 --> 3 --> 1.  She participates in a research study that detected free tumor DNA in her circulation when she therefore underwent a PET scan which showed an enlarged precarinal lymph node.  She was referred to pulmonology for further evaluation.    CMP 04/24/2024 reviewed, unremarkable.  CBC 05/06/2024 with mild anemia, hemoglobin 10.1, otherwise unremarkable.  EKG 05/02/2024 (Care Everywhere): Sinus rhythm with PVCs.  Rate 85.  TTE 01/04/2022 (Care Everywhere): SUMMARY  Mild left ventricular hypertrophy  Left ventricular systolic function is normal.  LV ejection fraction = 60-65%.  There is aortic valve sclerosis.  There is no aortic stenosis.  There is no comparison study available   Exercise treadmill stress test 01/04/2022 (Care Everywhere): Conclusion   1. Exercise capacity was fair for age exercised for 5 minutes and 1 second of the Bruce protocol achieving 7 METS.  2. The stress test was negative for ischemia no diagnostic EKG changes at target heart rate.  3. Hypertensive response to exercise with peak blood pressure 210-90       Neuro/Psych  Neuromuscular disease  negative psych ROS   GI/Hepatic ,neg GERD  ,,(+)     (-) substance abuse    Endo/Other  neg diabetes    Renal/GU negative Renal ROS     Musculoskeletal   Abdominal   Peds  Hematology   Anesthesia Other Findings Past Medical History: No date: Dermatomyositis (HCC)     Comment:  anti TIF-y No date: Hyperlipidemia No date: Hypertension  Reproductive/Obstetrics  Anesthesia Physical Anesthesia Plan  ASA: 2  Anesthesia Plan: General   Post-op Pain Management:    Induction: Intravenous  PONV Risk Score and Plan: 3 and Ondansetron  and Dexamethasone   Airway Management Planned: Oral ETT  Additional Equipment: None  Intra-op Plan:   Post-operative Plan: Extubation in  OR  Informed Consent: I have reviewed the patients History and Physical, chart, labs and discussed the procedure including the risks, benefits and alternatives for the proposed anesthesia with the patient or authorized representative who has indicated his/her understanding and acceptance.     Dental advisory given  Plan Discussed with: CRNA and Surgeon  Anesthesia Plan Comments: (Discussed risks of anesthesia with patient, including PONV, sore throat, lip/dental/eye damage. Rare risks discussed as well, such as cardiorespiratory and neurological sequelae, and allergic reactions. Discussed the role of CRNA in patient's perioperative care. Patient understands.)         Anesthesia Quick Evaluation

## 2024-05-14 ENCOUNTER — Encounter: Payer: Self-pay | Admitting: Physician Assistant

## 2024-05-14 ENCOUNTER — Ambulatory Visit (HOSPITAL_COMMUNITY)
Admission: RE | Admit: 2024-05-14 | Discharge: 2024-05-14 | Disposition: A | Discharge status: Home / Self Care | Attending: Emergency Medicine | Admitting: Emergency Medicine

## 2024-05-14 ENCOUNTER — Other Ambulatory Visit: Payer: Self-pay

## 2024-05-14 ENCOUNTER — Ambulatory Visit (HOSPITAL_COMMUNITY): Payer: Self-pay | Admitting: Physician Assistant

## 2024-05-14 ENCOUNTER — Encounter (HOSPITAL_COMMUNITY)
Admission: RE | Disposition: A | Discharge status: Home / Self Care | Payer: Self-pay | Source: Home / Self Care | Attending: Emergency Medicine

## 2024-05-14 ENCOUNTER — Encounter (HOSPITAL_COMMUNITY): Payer: Self-pay | Admitting: Emergency Medicine

## 2024-05-14 DIAGNOSIS — M3313 Other dermatomyositis without myopathy: Secondary | ICD-10-CM | POA: Insufficient documentation

## 2024-05-14 DIAGNOSIS — I471 Supraventricular tachycardia, unspecified: Secondary | ICD-10-CM | POA: Insufficient documentation

## 2024-05-14 DIAGNOSIS — Z91040 Latex allergy status: Secondary | ICD-10-CM | POA: Diagnosis not present

## 2024-05-14 DIAGNOSIS — R590 Localized enlarged lymph nodes: Secondary | ICD-10-CM | POA: Insufficient documentation

## 2024-05-14 DIAGNOSIS — Z79899 Other long term (current) drug therapy: Secondary | ICD-10-CM | POA: Insufficient documentation

## 2024-05-14 DIAGNOSIS — Z87891 Personal history of nicotine dependence: Secondary | ICD-10-CM | POA: Diagnosis not present

## 2024-05-14 DIAGNOSIS — I10 Essential (primary) hypertension: Secondary | ICD-10-CM | POA: Diagnosis not present

## 2024-05-14 DIAGNOSIS — R599 Enlarged lymph nodes, unspecified: Secondary | ICD-10-CM | POA: Diagnosis present

## 2024-05-14 HISTORY — PX: VIDEO BRONCHOSCOPY WITH ENDOBRONCHIAL ULTRASOUND: SHX6177

## 2024-05-14 SURGERY — BRONCHOSCOPY, WITH EBUS
Anesthesia: General

## 2024-05-14 MED ORDER — FENTANYL CITRATE (PF) 100 MCG/2ML IJ SOLN: 25.0000 ug | INTRAMUSCULAR | Status: DC | PRN

## 2024-05-14 MED ORDER — PHENYLEPHRINE 80 MCG/ML (10ML) SYRINGE FOR IV PUSH (FOR BLOOD PRESSURE SUPPORT)
PREFILLED_SYRINGE | INTRAVENOUS | Status: DC | PRN
  Administered 2024-05-14 (×3): 160 ug via INTRAVENOUS
  Administered 2024-05-14: 80 ug via INTRAVENOUS
  Administered 2024-05-14: 160 ug via INTRAVENOUS
  Administered 2024-05-14: 80 ug via INTRAVENOUS

## 2024-05-14 MED ORDER — ACETAMINOPHEN 10 MG/ML IV SOLN: 1000.0000 mg | Freq: Once | INTRAVENOUS | Status: DC | PRN

## 2024-05-14 MED ORDER — PROPOFOL 500 MG/50ML IV EMUL
INTRAVENOUS | Status: DC | PRN
  Administered 2024-05-14 (×2): 100 ug/kg/min via INTRAVENOUS

## 2024-05-14 MED ORDER — OXYCODONE HCL 5 MG PO TABS: 5.0000 mg | ORAL_TABLET | Freq: Once | ORAL | Status: DC | PRN

## 2024-05-14 MED ORDER — MYCOPHENOLATE MOFETIL 500 MG PO TABS: 1500.0000 mg | ORAL_TABLET | Freq: Two times a day (BID) | ORAL | Status: AC

## 2024-05-14 MED ORDER — DEXMEDETOMIDINE HCL IN NACL 80 MCG/20ML IV SOLN
INTRAVENOUS | Status: DC | PRN
  Administered 2024-05-14 (×2): 12 ug via INTRAVENOUS

## 2024-05-14 MED ORDER — CHLORHEXIDINE GLUCONATE 0.12 % MT SOLN
15.0000 mL | Freq: Once | OROMUCOSAL | Status: AC
  Administered 2024-05-14 (×2): 15 mL via OROMUCOSAL
  Filled 2024-05-14: qty 15

## 2024-05-14 MED ORDER — PRIVIGEN 20 GM/200ML IV SOLN: INTRAVENOUS | Status: AC

## 2024-05-14 MED ORDER — EPHEDRINE SULFATE-NACL 50-0.9 MG/10ML-% IV SOSY
PREFILLED_SYRINGE | INTRAVENOUS | Status: DC | PRN
  Administered 2024-05-14 (×4): 10 mg via INTRAVENOUS

## 2024-05-14 MED ORDER — OXYCODONE HCL 5 MG/5ML PO SOLN: 5.0000 mg | Freq: Once | ORAL | Status: DC | PRN

## 2024-05-14 MED ORDER — ONDANSETRON HCL 4 MG/2ML IJ SOLN
INTRAMUSCULAR | Status: DC | PRN
  Administered 2024-05-14 (×2): 4 mg via INTRAVENOUS

## 2024-05-14 MED ORDER — ONDANSETRON HCL 4 MG/2ML IJ SOLN: 4.0000 mg | Freq: Once | INTRAMUSCULAR | Status: DC | PRN

## 2024-05-14 MED ORDER — LIDOCAINE 2% (20 MG/ML) 5 ML SYRINGE
INTRAMUSCULAR | Status: DC | PRN
  Administered 2024-05-14 (×2): 80 mg via INTRAVENOUS

## 2024-05-14 MED ORDER — METOPROLOL SUCCINATE ER 25 MG PO TB24
25.0000 mg | ORAL_TABLET | Freq: Once | ORAL | Status: AC
  Administered 2024-05-14 (×2): 25 mg via ORAL
  Filled 2024-05-14: qty 1

## 2024-05-14 MED ORDER — PROPOFOL 10 MG/ML IV BOLUS
INTRAVENOUS | Status: DC | PRN
Start: 2024-05-14 — End: 2024-05-14
  Administered 2024-05-14 (×2): 100 mg via INTRAVENOUS

## 2024-05-14 MED ORDER — ROCURONIUM BROMIDE 100 MG/10ML IV SOLN
INTRAVENOUS | Status: DC | PRN
Start: 2024-05-14 — End: 2024-05-14
  Administered 2024-05-14 (×2): 50 mg via INTRAVENOUS

## 2024-05-14 MED ORDER — METOPROLOL SUCCINATE ER 25 MG PO TB24: 25.0000 mg | ORAL_TABLET | Freq: Every day | ORAL | Status: DC

## 2024-05-14 MED ORDER — SUGAMMADEX SODIUM 200 MG/2ML IV SOLN
INTRAVENOUS | Status: DC | PRN
  Administered 2024-05-14 (×2): 200 mg via INTRAVENOUS

## 2024-05-14 MED ORDER — LACTATED RINGERS IV SOLN: INTRAVENOUS | Status: DC

## 2024-05-14 MED ORDER — DEXAMETHASONE SODIUM PHOSPHATE 10 MG/ML IJ SOLN
INTRAMUSCULAR | Status: DC | PRN
  Administered 2024-05-14 (×2): 10 mg via INTRAVENOUS

## 2024-05-14 MED ORDER — FENTANYL CITRATE (PF) 250 MCG/5ML IJ SOLN
INTRAMUSCULAR | Status: DC | PRN
  Administered 2024-05-14 (×4): 50 ug via INTRAVENOUS

## 2024-05-14 NOTE — Discharge Instructions (Addendum)

## 2024-05-14 NOTE — Transfer of Care (Signed)
 Immediate Anesthesia Transfer of Care Note  Patient: Jessica Ellis  Procedure(s) Performed: BRONCHOSCOPY, WITH EBUS  Patient Location: PACU  Anesthesia Type:General  Level of Consciousness: awake and drowsy  Airway & Oxygen Therapy: Patient Spontanous Breathing and Patient connected to face mask oxygen  Post-op Assessment: Report given to RN, Post -op Vital signs reviewed and stable, and Patient moving all extremities X 4  Post vital signs: Reviewed and stable  Last Vitals:  Vitals Value Taken Time  BP 121/50 05/14/24 08:32  Temp    Pulse 68 05/14/24 08:34  Resp 17 05/14/24 08:34  SpO2 99 % 05/14/24 08:34  Vitals shown include unfiled device data.  Last Pain:  Vitals:   05/14/24 0832  TempSrc:   PainSc: Asleep      Patients Stated Pain Goal: 2 (05/14/24 9388)  Complications: No notable events documented.

## 2024-05-14 NOTE — Anesthesia Procedure Notes (Signed)
 Procedure Name: Intubation Date/Time: 05/14/2024 7:38 AM  Performed by: Mollie Olivia SAUNDERS, CRNAPre-anesthesia Checklist: Patient identified, Emergency Drugs available, Suction available and Patient being monitored Patient Re-evaluated:Patient Re-evaluated prior to induction Oxygen Delivery Method: Circle system utilized Preoxygenation: Pre-oxygenation with 100% oxygen Induction Type: IV induction Ventilation: Mask ventilation without difficulty Laryngoscope Size: Glidescope and 3 Grade View: Grade I Tube type: Oral Tube size: 8.5 mm Number of attempts: 1 Airway Equipment and Method: Stylet and Oral airway Placement Confirmation: ETT inserted through vocal cords under direct vision, positive ETCO2 and breath sounds checked- equal and bilateral Tube secured with: Tape Dental Injury: Teeth and Oropharynx as per pre-operative assessment

## 2024-05-14 NOTE — Interval H&P Note (Signed)
 History and Physical Interval Note:  05/14/2024 7:18 AM  Jessica Ellis  has presented today for surgery, with the diagnosis of pre tracheallymph node.  The various methods of treatment have been discussed with the patient and family. After consideration of risks, benefits and other options for treatment, the patient has consented to  Procedure(s) with comments: BRONCHOSCOPY, WITH EBUS (N/A) - mediastinal as a surgical intervention.  The patient's history has been reviewed, patient examined, no change in status, stable for surgery.  I have reviewed the patient's chart and labs.  Questions were answered to the patient's satisfaction.     Lamar GORMAN Chris

## 2024-05-14 NOTE — Op Note (Signed)
 Video Bronchoscopy with Endobronchial Ultrasound Procedure Note  Date of Operation: 05/14/2024  Pre-op Diagnosis: Precarinal lymphadenopathy  Post-op Diagnosis: Same  Surgeon: LAMAR CHRIS  Assistants: None  Anesthesia: General endotracheal anesthesia  Operation: Flexible video fiberoptic bronchoscopy with endobronchial ultrasound and biopsies.  Estimated Blood Loss: Minimal  Complications: None apparent  Indications and History: Jessica Ellis is a 68 y.o. female with history of former tobacco use.  She is here for evaluation of a spuriously identified enlarged precarinal node on her surveillance PET scan.  She took place in a clinical trial regarding free tumor DNA, prompted the PET scan.  She is asymptomatic.  Recommendation made to achieve a tissue diagnosis via endobronchial ultrasound with biopsies.  The risks, benefits, complications, treatment options and expected outcomes were discussed with the patient.  The possibilities of pneumothorax, pneumonia, reaction to medication, pulmonary aspiration, perforation of a viscus, bleeding, failure to diagnose a condition and creating a complication requiring transfusion or operation were discussed with the patient who freely signed the consent.    Description of Procedure: The patient was examined in the preoperative area and history and data from the preprocedure consultation were reviewed. It was deemed appropriate to proceed.  The patient was taken to [facility] room 3, identified as Almarie LITTIE Cedar and the procedure verified as Flexible Video Fiberoptic Bronchoscopy.  A Time Out was held and the above information confirmed. After being taken to the operating room general anesthesia was initiated and the patient  was orally intubated. The video fiberoptic bronchoscope was introduced via the endotracheal tube and a general inspection was performed which showed normal airways throughout.  There were no abnormal secretions or  endobronchial lesions seen.  A bronchoalveolar lavage was performed in the superior segment of the left lower lobe to be sent for microbiology.  60 cc normal saline instilled and approximately 25 cc returned.  The standard scope was then withdrawn and the endobronchial ultrasound was used to identify and characterize the peritracheal, hilar and bronchial lymph nodes. Inspection showed slightly enlarged node at station 4R in the precarinal region.  There were no other pathologically enlarged nodes seen at any stations.. Using real-time ultrasound guidance Wang needle biopsies were take from Station 4R node and was sent for cytology. The patient tolerated the procedure well without apparent complications. There was no significant blood loss. The bronchoscope was withdrawn. Anesthesia was reversed and the patient was taken to the PACU for recovery.   Samples: 1. Wang needle biopsies from 4R node  Plans:  The patient will be discharged from the PACU to home when recovered from anesthesia. We will review the cytology, pathology and microbiology results with the patient when they become available. Outpatient followup will be with GORMAN Lites, NP and Dr CHRIS. SABRA    Milissa Fesperman S. 05/14/2024

## 2024-05-14 NOTE — Anesthesia Postprocedure Evaluation (Signed)
 Anesthesia Post Note  Patient: Jessica Ellis  Procedure(s) Performed: BRONCHOSCOPY, WITH EBUS     Patient location during evaluation: PACU Anesthesia Type: General Level of consciousness: awake and alert Pain management: pain level controlled Vital Signs Assessment: post-procedure vital signs reviewed and stable Respiratory status: spontaneous breathing, nonlabored ventilation, respiratory function stable and patient connected to nasal cannula oxygen Cardiovascular status: blood pressure returned to baseline and stable Postop Assessment: no apparent nausea or vomiting Anesthetic complications: no   No notable events documented.  Last Vitals:  Vitals:   05/14/24 0915 05/14/24 0930  BP: 95/64 (!) 102/53  Pulse: 72 71  Resp: 19 15  Temp:  36.6 C  SpO2: 99% 99%    Last Pain:  Vitals:   05/14/24 0832  TempSrc:   PainSc: Asleep                 Rome Ade

## 2024-05-16 LAB — CYTOLOGY - NON PAP

## 2024-05-16 LAB — CULTURE, BAL-QUANTITATIVE W GRAM STAIN
Culture: NO GROWTH
Gram Stain: NONE SEEN

## 2024-05-16 LAB — ACID FAST SMEAR (AFB, MYCOBACTERIA): Acid Fast Smear: NEGATIVE

## 2024-05-16 LAB — FUNGAL STAIN REFLEX

## 2024-05-16 LAB — FUNGUS STAIN

## 2024-05-17 ENCOUNTER — Encounter: Payer: Self-pay | Admitting: Medical Oncology

## 2024-05-17 NOTE — Progress Notes (Signed)
 [facility]  Outgoing call to patient to schedule a lab appt for next week, per PA-C Johnston Abernethy request. Patient stated she is out of town on vacation, returning 05/24/24. Patient asked for me to call her this Monday the 18th to set that appointment. Patient thanked.  Colene KYM Raider, RN, BSN, [facility] Oncology Nurse Navigator, [facility] 05/17/2024 2:49 PM

## 2024-05-19 LAB — AEROBIC/ANAEROBIC CULTURE W GRAM STAIN (SURGICAL/DEEP WOUND)
Culture: NO GROWTH
Gram Stain: NONE SEEN

## 2024-05-20 ENCOUNTER — Encounter (HOSPITAL_COMMUNITY): Payer: Self-pay | Admitting: Physician Assistant

## 2024-05-21 ENCOUNTER — Encounter: Payer: Self-pay | Admitting: Medical Oncology

## 2024-05-21 NOTE — Progress Notes (Signed)
 [facility]   [facility] with patient regarding scheduling lab appt. My direct contact information provided.   Colene KYM Raider, RN, BSN, [facility] Oncology Nurse Navigator, [facility] 05/21/2024 12:13 PM

## 2024-05-23 ENCOUNTER — Encounter: Payer: Self-pay | Admitting: Internal Medicine

## 2024-05-24 ENCOUNTER — Ambulatory Visit: Admitting: Acute Care

## 2024-05-27 ENCOUNTER — Encounter: Payer: Self-pay | Admitting: Medical Oncology

## 2024-05-27 ENCOUNTER — Encounter: Payer: Self-pay | Admitting: Acute Care

## 2024-05-27 ENCOUNTER — Ambulatory Visit: Admitting: Acute Care

## 2024-05-27 ENCOUNTER — Other Ambulatory Visit (HOSPITAL_COMMUNITY): Payer: Self-pay | Admitting: Physician Assistant

## 2024-05-27 VITALS — BP 114/68 | HR 89 | Temp 98.3°F | Ht 63.5 in | Wt 146.0 lb

## 2024-05-27 DIAGNOSIS — Z87891 Personal history of nicotine dependence: Secondary | ICD-10-CM

## 2024-05-27 DIAGNOSIS — R9389 Abnormal findings on diagnostic imaging of other specified body structures: Secondary | ICD-10-CM | POA: Diagnosis not present

## 2024-05-27 DIAGNOSIS — Z9889 Other specified postprocedural states: Secondary | ICD-10-CM

## 2024-05-27 DIAGNOSIS — D539 Nutritional anemia, unspecified: Secondary | ICD-10-CM

## 2024-05-27 NOTE — Progress Notes (Signed)
 [facility]  Call to patient to schedule lab appointment for anemia workup. Patient confirms knowledge that her biopsy results show no malignancy. Per PA-C Irene's request, lab appointment scheduled for 05/29/2024 @ 10:45 AM for anemia workup. Patient gave verbal understanding. Denies questions at this time. Patient encouraged to call with questions/concerns.   Jessica KYM Raider, RN, BSN, [facility] Oncology Nurse Navigator, [facility] 05/27/2024 12:12 PM

## 2024-05-27 NOTE — Patient Instructions (Signed)
 It is good to see you today. I am so glad you have done well since the biopsy Your biopsy was negative for the malignancy. This is reassuring. We will do a 3 month follow up CT Chest which will be due in November 2025. You  will get a call to get this scheduled.  You will follow up with Dr. Shelah or myself 1-2 weeks after the scan to review the results. Call if you need us  sooner. Call for any unexplained weight loss or blood in your sputum.  Please contact office for sooner follow up if symptoms do not improve or worsen or seek emergency care

## 2024-05-27 NOTE — Progress Notes (Signed)
 History of Present Illness Jessica Ellis is a 68 y.o. female  former smoker referred to Jessica Ellis 04/2024 for consideration of EBUS for further evaluation of an enlarged pre-carinal lymph node.   Synopsis Jessica Ellis is a 68 year old female with dermatomyositis, treated by a physician at [facility] [place] MD,  who presents for evaluation of an enlarged  pre carinal lymph node. She was referred by her doctor at [facility] for evaluation after the lymph node was discovered during a surveillance PET scan.   Jessica Ellis underwent PET/CT imaging on 12/29/2023 as recommended by rheumatologist as she participated in a research study where free tumor DNA was detected in her circulation. PET scan findings included two right sided lung nodules that were measuring up to 10 mm without hypermetabolism and solitary hypermetabolic precarinal lymph node measuring 10 x 13 mm with SUV 6.6. The plan was to monitor with serial CT imaging so she underwent repeat CT chest on 04/14/2024. Findings showed diminished conspicuity of lung nodules with is more consistent with infectious process. The pretracheal/precarinal lymph node was persistent and unchanged in size.    She was been referred to the rapid diagnostic clinic for evaluation of enlarged pretracheal lymph node. She was seen by Jessica Police, Jessica Ellis, who saw her in conjunction with Jessica Ellis. He felt at this time there is a low clinical suspicion that this represents an underlying malignancy, however recommended consideration of biopsy, and referred to pulmonology for consideration of EBUS with biopsy of the lesion. Pt. Was in agreement with the plan.She would like to know what this is. She was scheduled for bronchoscopy with EBUS and biopsies to be done  05/14/2024.  She is here today to follow up her biopsy results, and to ensure she has done well post procedure.      05/27/2024 Discussed the use of AI scribe software for clinical note  transcription with the patient, who gave verbal consent to proceed.  History of Present Illness Jessica Ellis is a 68 year old female who presents for follow-up with her husband after a navigational bronchoscopy and EBUS  with biopsy.   We have reviewed the results of her biopsy.  The 4R lymph node showed lymphoid tissue negative for malignancy. Post-procedure, she experienced no bleeding, shortness of breath, discolored secretions, or fever, and tolerated the anesthesia well. A bronchoalveolar lavage (BAL) was performed, that showed  no  growth The endobronchial ultrasound revealed a slightly enlarged station 4 lymph node, which was biopsied and sent to cytology. Cytology was negative for malignancy. Cultures, including fungal and acid-fast bacillus (AFB), and aerobic, anaerobic cultures were negative.   Plan will be for a 3 month follow up Ct Chest as short term surveillance. Pt. And her husband are in agreement with the plan. Pt. Physician at [facility] should be able to see the results  through Care Everywhere.       Test Results: 05/14/2024 B. LYMPH NODE, 4R, FINE NEEDLE ASPIRATION:  -Limited lymphoid tissue and scattered benign bronchial cells.  - Negative for malignancy   BAL negative  Aerobic anaerobic No growth  Aerobic/ Anaerobic Culture with Gram Stains>> No growth Fungal >> Negative AFB >> Negative     Latest Ref Rng & Units 05/06/2024   12:23 PM 04/24/2024    3:28 PM 09/17/2010    7:22 AM  CBC  WBC 4.0 - 10.5 K/uL 4.7  3.5    Hemoglobin 12.0 - 15.0 g/dL 10.1  9.8  15.2   Hematocrit 36.0 - 46.0 % 31.0  30.4    Platelets 150 - 400 K/uL 183  167         Latest Ref Rng & Units 04/24/2024    3:28 PM 04/14/2024    4:06 PM  BMP  Glucose 70 - 99 mg/dL 897    BUN 8 - 23 mg/dL 15    Creatinine 9.55 - 1.00 mg/dL 9.33  9.09   Sodium 864 - 145 mmol/L 141    Potassium 3.5 - 5.1 mmol/L 3.9    Chloride 98 - 111 mmol/L 108    CO2 22 - 32 mmol/L 28    Calcium  8.9 - 10.3 mg/dL 8.8      BNP No results found for: BNP  ProBNP No results found for: PROBNP  PFT    Component Value Date/Time   FEV1PRE 2.12 03/18/2021 1303   FEV1POST 2.15 03/18/2021 1303   FVCPRE 2.86 03/18/2021 1303   FVCPOST 2.88 03/18/2021 1303   TLC 4.93 03/18/2021 1303   DLCOUNC 20.55 03/18/2021 1303   PREFEV1FVCRT 74 03/18/2021 1303   PSTFEV1FVCRT 75 03/18/2021 1303    No results found.   Past medical hx Past Medical History:  Diagnosis Date   Dermatomyositis (HCC)    anti TIF-y   Hyperlipidemia    Hypertension      Social History   Tobacco Use   Smoking status: Former    Current packs/day: 0.00    Average packs/day: 1 pack/day for 40.0 years (40.0 ttl pk-yrs)    Types: Cigarettes    Start date: 12/17/1972    Quit date: 12/17/2012    Years since quitting: 11.4    Passive exposure: Past   Smokeless tobacco: Never  Vaping Use   Vaping status: Never Used  Substance Use Topics   Alcohol use: Not Currently   Drug use: Never    Jessica Ellis reports that she quit smoking about 11 years ago. Her smoking use included cigarettes. She started smoking about 51 years ago. She has a 40 pack-year smoking history. She has been exposed to tobacco smoke. She has never used smokeless tobacco. She reports that she does not currently use alcohol. She reports that she does not use drugs.  Tobacco Cessation: Counseling given: Not Answered Former smoker with a 40 pack year smoking history. Quit 2014.  Past surgical hx, Family hx, Social hx all reviewed.  Current Outpatient Medications on File Prior to Visit  Medication Sig   amphetamine-dextroamphetamine (ADDERALL) 15 MG tablet Take 15 mg by mouth daily.   atorvastatin (LIPITOR) 20 MG tablet Take 20 mg by mouth every other day.   Cholecalciferol 50 MCG (2000 UT) CAPS once a week.   metoprolol  succinate (TOPROL -XL) 25 MG 24 hr tablet Take 25 mg by mouth daily.   mycophenolate  (CELLCEPT ) 500 MG tablet Take 3 tablets  (1,500 mg total) by mouth 2 (two) times daily. Takes 1500 mg  2 times a day.   Naproxen 375 MG TBEC Take 375 mg by mouth as needed.   omeprazole (PRILOSEC) 20 MG capsule Take by mouth daily.   PRIVIGEN  20 GM/200ML SOLN Every 10 weeks   timolol (TIMOPTIC) 0.5 % ophthalmic solution Place 1 drop into both eyes daily.   valsartan (DIOVAN) 80 MG tablet Take 80 mg by mouth 2 (two) times daily.   Vitamin D, Ergocalciferol, (DRISDOL) 1.25 MG (50000 UNIT) CAPS capsule Take 50,000 Units by mouth once a week.   No current facility-administered medications on  file prior to visit.     No Known Allergies  Review Of Systems:  Constitutional:   No  weight loss, night sweats,  Fevers, chills, fatigue, or  lassitude.  HEENT:   No headaches,  Difficulty swallowing,  Tooth/dental problems, or  Sore throat,                No sneezing, itching, ear ache, nasal congestion, post nasal drip,   CV:  No chest pain,  Orthopnea, PND, swelling in lower extremities, anasarca, dizziness, palpitations, syncope.   GI  No heartburn, indigestion, abdominal pain, nausea, vomiting, diarrhea, change in bowel habits, loss of appetite, bloody stools.   Resp: No shortness of breath with exertion or at rest.  No excess mucus, no productive cough,  No non-productive cough,  No coughing up of blood.  No change in color of mucus.  No wheezing.  No chest wall deformity  Skin: no rash or lesions.  GU: no dysuria, change in color of urine, no urgency or frequency.  No flank pain, no hematuria   MS:  No joint pain or swelling.  No decreased range of motion.  No back pain.  Psych:  No change in mood or affect. No depression or anxiety.  No memory loss.   Vital Signs BP 109/63   Pulse 89   Temp 98.3 F (36.8 C) (Oral)   Ht 5' 3.5 (1.613 m)   Wt 146 lb (66.2 kg)   SpO2 98%   BMI 25.46 kg/m    Physical Exam:  General- No distress,  A&Ox3, pleasant and appropriate ENT: No sinus tenderness, TM clear, pale nasal mucosa,  no oral exudate,no post nasal drip, no LAN Cardiac: S1, S2, regular rate and rhythm, no murmur Chest: No wheeze/ rales/ dullness; no accessory muscle use, no nasal flaring, no sternal retractions Abd.: Soft Non-tender, ND, BS +, Body mass index is 25.46 kg/m.  Ext: No clubbing cyanosis, edema, no obvious deformities Neuro:  normal strength, MAE x 4, A&O x 3 Skin: No rashes, warm and dry, no obvious skin lesions  Psych: normal mood and behavior     Assessment & Plan Mediastinal lymph node of uncertain significance Not hypermetabolic on PET Former smoker  Solid hypermetabolic precoronal lymph node stable in size with low suspicion of malignancy.>> Negative for malignancy per biopsy Post bronchoscopy with EBUS Plan Your biopsy was negative for the malignancy. This is reassuring. We will do a 3 month follow up CT Chest which will be due in November 2025. You  will get a call to get this scheduled.  You will follow up with Jessica Ellis or myself 1-2 weeks after the scan to review the results. Call if you need us  sooner. Call for any unexplained weight loss or blood in your sputum.  Please contact office for sooner follow up if symptoms do not improve or worsen or seek emergency care     I spent 25 minutes dedicated to the care of this patient on the date of this encounter to include pre-visit review of records, face-to-face time with the patient discussing conditions above, post visit ordering of testing, clinical documentation with the electronic health record, making appropriate referrals as documented, and communicating necessary information to the patient's healthcare team.         Jessica JULIANNA Lites, Jessica Ellis 05/27/2024  9:43 AM

## 2024-05-29 ENCOUNTER — Inpatient Hospital Stay

## 2024-05-29 DIAGNOSIS — D539 Nutritional anemia, unspecified: Secondary | ICD-10-CM

## 2024-05-29 DIAGNOSIS — R591 Generalized enlarged lymph nodes: Secondary | ICD-10-CM | POA: Diagnosis not present

## 2024-05-29 LAB — CBC WITH DIFFERENTIAL (CANCER CENTER ONLY)
Abs Immature Granulocytes: 0.01 K/uL (ref 0.00–0.07)
Basophils Absolute: 0 K/uL (ref 0.0–0.1)
Basophils Relative: 1 %
Eosinophils Absolute: 0.1 K/uL (ref 0.0–0.5)
Eosinophils Relative: 1 %
HCT: 32.1 % — ABNORMAL LOW (ref 36.0–46.0)
Hemoglobin: 10 g/dL — ABNORMAL LOW (ref 12.0–15.0)
Immature Granulocytes: 0 %
Lymphocytes Relative: 17 %
Lymphs Abs: 0.6 K/uL — ABNORMAL LOW (ref 0.7–4.0)
MCH: 28.6 pg (ref 26.0–34.0)
MCHC: 31.2 g/dL (ref 30.0–36.0)
MCV: 91.7 fL (ref 80.0–100.0)
Monocytes Absolute: 0.3 K/uL (ref 0.1–1.0)
Monocytes Relative: 8 %
Neutro Abs: 2.6 K/uL (ref 1.7–7.7)
Neutrophils Relative %: 73 %
Platelet Count: 134 K/uL — ABNORMAL LOW (ref 150–400)
RBC: 3.5 MIL/uL — ABNORMAL LOW (ref 3.87–5.11)
RDW: 15.3 % (ref 11.5–15.5)
WBC Count: 3.6 K/uL — ABNORMAL LOW (ref 4.0–10.5)
nRBC: 0 % (ref 0.0–0.2)

## 2024-05-29 LAB — SAVE SMEAR(SSMR), FOR PROVIDER SLIDE REVIEW

## 2024-05-29 LAB — DIRECT ANTIGLOBULIN TEST (NOT AT ARMC)
DAT, IgG: NEGATIVE
DAT, complement: NEGATIVE

## 2024-05-29 LAB — LACTATE DEHYDROGENASE: LDH: 141 U/L (ref 98–192)

## 2024-05-30 LAB — KAPPA/LAMBDA LIGHT CHAINS
Kappa free light chain: 8.2 mg/L (ref 3.3–19.4)
Kappa, lambda light chain ratio: 0.87 (ref 0.26–1.65)
Lambda free light chains: 9.4 mg/L (ref 5.7–26.3)

## 2024-05-30 LAB — HAPTOGLOBIN: Haptoglobin: 95 mg/dL (ref 37–355)

## 2024-05-31 LAB — COPPER, SERUM: Copper: 122 ug/dL (ref 80–158)

## 2024-05-31 NOTE — Progress Notes (Signed)
 I agree with the plans as outlined above.   Lamar Chris, MD, PhD 05/31/2024, 9:38 AM [facility] [phone] or if no answer before 7:00PM call [phone] For any issues after 7:00PM please call eLink [phone]

## 2024-06-03 LAB — MULTIPLE MYELOMA PANEL, SERUM
Albumin SerPl Elph-Mcnc: 3.3 g/dL (ref 2.9–4.4)
Albumin/Glob SerPl: 1.2 (ref 0.7–1.7)
Alpha 1: 0.3 g/dL (ref 0.0–0.4)
Alpha2 Glob SerPl Elph-Mcnc: 0.8 g/dL (ref 0.4–1.0)
B-Globulin SerPl Elph-Mcnc: 1 g/dL (ref 0.7–1.3)
Gamma Glob SerPl Elph-Mcnc: 0.9 g/dL (ref 0.4–1.8)
Globulin, Total: 2.9 g/dL (ref 2.2–3.9)
IgA: 102 mg/dL (ref 87–352)
IgG (Immunoglobin G), Serum: 962 mg/dL (ref 586–1602)
IgM (Immunoglobulin M), Srm: 54 mg/dL (ref 26–217)
Total Protein ELP: 6.2 g/dL (ref 6.0–8.5)

## 2024-06-04 ENCOUNTER — Encounter: Payer: Self-pay | Admitting: Physician Assistant

## 2024-06-15 LAB — FUNGUS CULTURE WITH STAIN

## 2024-06-15 LAB — FUNGUS CULTURE RESULT

## 2024-06-15 LAB — FUNGAL ORGANISM REFLEX

## 2024-06-28 LAB — ACID FAST CULTURE WITH REFLEXED SENSITIVITIES (MYCOBACTERIA): Acid Fast Culture: NEGATIVE

## 2024-07-22 ENCOUNTER — Other Ambulatory Visit: Payer: Self-pay | Admitting: Orthopaedic Surgery

## 2024-07-22 DIAGNOSIS — M19011 Primary osteoarthritis, right shoulder: Secondary | ICD-10-CM

## 2024-07-24 ENCOUNTER — Telehealth: Payer: Self-pay | Admitting: Pulmonary Disease

## 2024-07-24 NOTE — Telephone Encounter (Signed)
 Fax received from Dr. Bonner Hair  with [facility] to perform an orthopedic surgery under general anesthesia and interscalene block on patient.  Patient needs surgery clearance. Surgery is pending. Patient was seen on 05/25/24. Office protocol is a risk assessment can be sent to surgeon if patient has been seen in 60 days or less.  I called and spoke with the pt and have her scheduled with Dr. Theophilus on 08/15/24   Will hold in clearance pool until this is completed.

## 2024-08-09 ENCOUNTER — Telehealth: Payer: Self-pay

## 2024-08-09 ENCOUNTER — Telehealth: Payer: Self-pay | Admitting: Pulmonary Disease

## 2024-08-09 NOTE — Telephone Encounter (Signed)
 Fax received from Dr. Bonner Hair with [facility] to perform a right total shoulder arthroplasty under general anesthesia with interscalene block  on patient.  Patient needs surgery clearance. Surgery is pending. Patient was seen on 05/27/24. Office protocol is a risk assessment can be sent to surgeon if patient has been seen in 60 days or less.   Pt will need appt for risk assessment  She is currently scheduled to f/u post CT Dec 2025  I called her to see if she wanted to come in sooner for risk assessment  There was no answer- LMTCB.  Routing to the clearance pool to f/u

## 2024-08-09 NOTE — Telephone Encounter (Signed)
 Copied from CRM #[id number]. Topic: General - Other >> Aug 09, 2024  1:09 PM Russell PARAS wrote: Reason for CRM:   Pt is returning call from [facility] concerning rescheduling of her risk assessment visit for upcoming surgery. Pt reports she canceled the appt due to surgery being postponed and does not need to reschedule to an earlier appt at this time.  NFN

## 2024-08-15 ENCOUNTER — Ambulatory Visit: Admitting: Pulmonary Disease

## 2024-08-20 NOTE — Telephone Encounter (Signed)
 Pt cancelled 08/15/24 ov with Dr. Theophilus  She is now scheduled for appt with Lauraine on 09/04/24  Called to see if she needs to be seen any sooner to get her cleared for surgery- no answer- LMTCB.

## 2024-08-20 NOTE — Telephone Encounter (Signed)
 duplicate

## 2024-08-21 NOTE — Telephone Encounter (Signed)
 Copied from CRM #[id number]. Topic: General - Other >> Aug 20, 2024  4:41 PM Lavanda D wrote: Reason for CRM: Patient is returning call from [facility] - She no longer needs the appt with Dr. Theophilus as she has changed her mind & will no longer be having the surgery. Patient will keep appt with Lauraine Lites on 12/3, she stated this one is for something separate.    ----------------------------------------------------------------------- From previous Reason for Contact - Scheduling: Patient/patient representative is calling to schedule an appointment. Refer to attachments for appointment information.

## 2024-08-27 ENCOUNTER — Ambulatory Visit
Admission: RE | Admit: 2024-08-27 | Discharge: 2024-08-27 | Disposition: A | Discharge status: Home / Self Care | Source: Ambulatory Visit | Attending: Acute Care | Admitting: Acute Care

## 2024-08-27 DIAGNOSIS — R9389 Abnormal findings on diagnostic imaging of other specified body structures: Secondary | ICD-10-CM

## 2024-09-04 ENCOUNTER — Other Ambulatory Visit: Payer: Self-pay

## 2024-09-04 ENCOUNTER — Ambulatory Visit: Admitting: Acute Care

## 2024-09-04 ENCOUNTER — Encounter: Payer: Self-pay | Admitting: Acute Care

## 2024-09-04 VITALS — BP 128/60 | HR 81 | Temp 98.2°F | Ht 63.5 in | Wt 149.6 lb

## 2024-09-04 DIAGNOSIS — R9389 Abnormal findings on diagnostic imaging of other specified body structures: Secondary | ICD-10-CM

## 2024-09-04 DIAGNOSIS — Z87891 Personal history of nicotine dependence: Secondary | ICD-10-CM | POA: Diagnosis not present

## 2024-09-04 DIAGNOSIS — R911 Solitary pulmonary nodule: Secondary | ICD-10-CM

## 2024-09-04 DIAGNOSIS — Z122 Encounter for screening for malignant neoplasm of respiratory organs: Secondary | ICD-10-CM

## 2024-09-04 NOTE — Progress Notes (Signed)
 History of Present Illness Jessica Ellis is a 68 y.o. female former smoker referred to Dr. Shelah 04/2024 for consideration of EBUS for further evaluation of an enlarged pre-carinal lymph node.    09/04/2024 Discussed the use of AI scribe software for clinical note transcription with the patient, who gave verbal consent to proceed.  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 She was referred by her doctor at [facility] for evaluation after the lymph node was discovered during a surveillance PET scan.   [name] underwent PET/CT imaging on 12/29/2023 as recommended by rheumatologist as she participated in a research study where free tumor DNA was detected in her circulation. PET scan findings included two right sided lung nodules that were measuring up to 10 mm without hypermetabolism and solitary hypermetabolic precarinal lymph node measuring 10 x 13 mm with SUV 6.6. The plan was to monitor with serial CT imaging so she underwent repeat CT chest on 04/14/2024. Findings showed diminished conspicuity of lung nodules with is more consistent with infectious process. The pretracheal/precarinal lymph node was persistent and unchanged in size.    She was been referred to the rapid diagnostic clinic for evaluation of enlarged pretracheal lymph node. She was seen by Johnston Police, PA-C, who saw her in conjunction with Dr. Federico. He felt at this time there is a low clinical suspicion that this represents an underlying malignancy, however recommended consideration of biopsy, and referred to pulmonology for consideration of EBUS with biopsy of the lesion. Pt. Was in agreement with the plan.She would like to know what this is. She was scheduled for bronchoscopy with EBUS and biopsies to be done  05/14/2024.   Biopsies were negative for malignancy.  Plan was for a 3 month follow up scan . She is here today to review the follow up scan results.  History of Present Illness Patient presents for follow-up after 15-month follow-up chest imaging to better evaluate pulmonary nodules.  We have reviewed the CT chest together which shows stable pulmonary nodules.  This is great news.  Recommendation per radiology is for an annual scan through the lung cancer screening program as patient qualifies, meeting all criteria. She is in agreement with that plan.  First low-dose CT through the screening program will be due November 2026.  I have asked the patient to call to be seen sooner for any unexplained weight loss or blood in her sputum.  She verbalized understanding.  She anticipates needing surgical clearance for a shoulder repair in the near future.  I have told her to give the office a call when she is ready to move forward with the surgery so we can provide the surgical clearance. She is in agreement with this plan.       Test Results: CT chest 08/27/2024 No mediastinal, hilar or axillary lymphadenopathy.   LUNGS AND PLEURA: Previously identified scattered pulmonary nodules bilaterally are stable (image 99/8). Scattered areas of tree-in-bud nodularity within the right lower lobe, are unchanged, likely post-inflammatory in nature. No new focal pulmonary nodules or infiltrates are identified. No pleural effusion or pneumothorax. No focal consolidation or pulmonary edema.   SOFT TISSUES/BONES: Osseous structures are age-appropriate. No acute bone abnormality. No lytic or blastic bone lesion. No acute abnormality of the soft tissues.   UPPER ABDOMEN: Small hiatal hernia. Limited images of  the upper abdomen demonstrates no acute abnormality.   IMPRESSION: 1. Stable scattered pulmonary nodules bilaterally and tree-in-bud nodularity in the right lower lobe, likely post-inflammatory in nature. No new focal pulmonary nodules or  infiltrates.   05/14/2024 B. LYMPH NODE, 4R, FINE NEEDLE ASPIRATION:  -Limited lymphoid tissue and scattered benign bronchial cells.  - Negative for malignancy    BAL negative  Aerobic anaerobic No growth   Aerobic/ Anaerobic Culture with Gram Stains>> No growth Fungal >> Negative AFB >> Negative  CT chest 04/14/2024 Diminished conspicuity of nodularity in the dependent right lung base, now tiny clustered and partially calcified nodules. Additional clustered centrilobular and tree-in-bud nodularity occasionally seen, for example in the superior segment left lower lobe and in the right lung base. 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 2. Unchanged prominent pretracheal lymph node measuring up to 1.1 x 0.9 cm, previously hypermetabolic, of uncertain significance. 3. No evidence of fibrotic interstitial lung disease. 4. Coronary artery disease.  PET 12/29/2023 Solitary hypermetabolic precarinal lymph node which is borderline size. This would have a differential. As this is isolated, could consider short follow-up in 3 months with CT. More aggressive approach could be considered as clinically appropriate.   Two right-sided lung nodules. Largest measures 10 mm and is somewhat ill-defined. These are not hypermetabolic but are new from 2023. This also could be assessed on short follow-up in 3 months.   Slightly nodular contour to liver. Splenic enlargement. Trace free fluid. Please correlate with history.   Small hiatal hernia.  Colonic diverticula.      Latest Ref Rng & Units 05/29/2024   11:05 AM 05/06/2024   12:23 PM 04/24/2024    3:28 PM  CBC  WBC 4.0 - 10.5 K/uL 3.6  4.7  3.5   Hemoglobin 12.0 - 15.0 g/dL 89.9  89.8  9.8   Hematocrit 36.0 - 46.0 % 32.1  31.0  30.4   Platelets 150 - 400 K/uL 134  183  167        Latest Ref Rng & Units 04/24/2024    3:28 PM 04/14/2024    4:06 PM   BMP  Glucose 70 - 99 mg/dL 897    BUN 8 - 23 mg/dL 15    Creatinine 9.55 - 1.00 mg/dL 9.33  9.09   Sodium 864 - 145 mmol/L 141    Potassium 3.5 - 5.1 mmol/L 3.9    Chloride 98 - 111 mmol/L 108    CO2 22 - 32 mmol/L 28    Calcium 8.9 - 10.3 mg/dL 8.8      BNP No results found for: BNP  ProBNP No results found for: PROBNP  PFT    Component Value Date/Time   FEV1PRE 2.12 03/18/2021 1303   FEV1POST 2.15 03/18/2021 1303   FVCPRE 2.86 03/18/2021 1303   FVCPOST 2.88 03/18/2021 1303   TLC 4.93 03/18/2021 1303   DLCOUNC 20.55 03/18/2021 1303   PREFEV1FVCRT 74 03/18/2021 1303   PSTFEV1FVCRT 75 03/18/2021 1303    CT CHEST WO CONTRAST Result Date: 09/04/2024 EXAM: CT CHEST WITHOUT CONTRAST 08/27/2024 11:57:42 AM TECHNIQUE: CT of the chest was performed without the administration of intravenous contrast. Multiplanar reformatted images are provided for review. Automated exposure control, iterative reconstruction, and/or weight based adjustment of the mA/kV was utilized to reduce the radiation dose to as low as reasonably achievable. COMPARISON: Findings are compared to 04/14/2024. CLINICAL HISTORY: Lung nodules, multiple. FINDINGS: MEDIASTINUM: Mild coronary artery calcification. Global  cardiac size within normal limits. No pericardial effusion. The central airways are clear. No central obstructing lesion. Central pulmonary arteries are of normal caliber. Mild atherosclerotic calcification within the thoracic aorta. No aortic aneurysm. LYMPH NODES: No mediastinal, hilar or axillary lymphadenopathy. LUNGS AND PLEURA: Previously identified scattered pulmonary nodules bilaterally are stable (image 99/8). Scattered areas of tree-in-bud nodularity within the right lower lobe, are unchanged, likely post-inflammatory in nature. No new focal pulmonary nodules or infiltrates are identified. No pleural effusion or pneumothorax. No focal consolidation or pulmonary edema. SOFT TISSUES/BONES: Osseous  structures are age-appropriate. No acute bone abnormality. No lytic or blastic bone lesion. No acute abnormality of the soft tissues. UPPER ABDOMEN: Small hiatal hernia. Limited images of the upper abdomen demonstrates no acute abnormality. IMPRESSION: 1. Stable scattered pulmonary nodules bilaterally and tree-in-bud nodularity in the right lower lobe, likely post-inflammatory in nature. No new focal pulmonary nodules or infiltrates. Electronically signed by: Dorethia Molt MD 09/04/2024 01:42 AM EST RP Workstation: [id number]     Past medical hx Past Medical History:  Diagnosis Date   Dermatomyositis (HCC)    anti TIF-y   Hyperlipidemia    Hypertension      Social History   Tobacco Use   Smoking status: Former    Current packs/day: 0.00    Average packs/day: 1 pack/day for 40.0 years (40.0 ttl pk-yrs)    Types: Cigarettes    Start date: 12/17/1972    Quit date: 12/17/2012    Years since quitting: 11.7    Passive exposure: Past   Smokeless tobacco: Never  Vaping Use   Vaping status: Never Used  Substance Use Topics   Alcohol use: Not Currently   Drug use: Never    Ms.Kashani reports that she quit smoking about 11 years ago. Her smoking use included cigarettes. She started smoking about 51 years ago. She has a 40 pack-year smoking history. She has been exposed to tobacco smoke. She has never used smokeless tobacco. She reports that she does not currently use alcohol. She reports that she does not use drugs.  Tobacco Cessation: Counseling given: Not Answered Former smoker quit 2014 with a 40-pack-year smoking history.  Past surgical hx, Family hx, Social hx all reviewed.  Current Outpatient Medications on File Prior to Visit  Medication Sig   amphetamine-dextroamphetamine (ADDERALL) 15 MG tablet Take 15 mg by mouth daily.   atorvastatin (LIPITOR) 20 MG tablet Take 20 mg by mouth every other day.   Cholecalciferol 50 MCG (2000 UT) CAPS once a week.   metoprolol  succinate  (TOPROL -XL) 25 MG 24 hr tablet Take 25 mg by mouth daily.   mycophenolate  (CELLCEPT ) 500 MG tablet Take 3 tablets (1,500 mg total) by mouth 2 (two) times daily. Takes 1500 mg  2 times a day.   Naproxen 375 MG TBEC Take 375 mg by mouth as needed.   omeprazole (PRILOSEC) 20 MG capsule Take by mouth daily.   PRIVIGEN  20 GM/200ML SOLN Every 10 weeks   timolol (TIMOPTIC) 0.5 % ophthalmic solution Place 1 drop into both eyes daily.   valsartan (DIOVAN) 80 MG tablet Take 80 mg by mouth 2 (two) times daily.   No current facility-administered medications on file prior to visit.     No Known Allergies  Review Of Systems:  Constitutional:   No  weight loss, night sweats,  Fevers, chills, fatigue, or  lassitude.  HEENT:   No headaches,  Difficulty swallowing,  Tooth/dental problems, or  Sore throat,  No sneezing, itching, ear ache, nasal congestion, post nasal drip,   CV:  No chest pain,  Orthopnea, PND, swelling in lower extremities, anasarca, dizziness, palpitations, syncope.   GI  No heartburn, indigestion, abdominal pain, nausea, vomiting, diarrhea, change in bowel habits, loss of appetite, bloody stools.   Resp: No shortness of breath with exertion or at rest.  No excess mucus, no productive cough,  No non-productive cough,  No coughing up of blood.  No change in color of mucus.  No wheezing.  No chest wall deformity  Skin: no rash or lesions.  GU: no dysuria, change in color of urine, no urgency or frequency.  No flank pain, no hematuria   MS:  + joint pain or swelling. (Shoulder)  + decreased range of motion.  No back pain.  Psych:  No change in mood or affect. No depression or anxiety.  No memory loss.   Vital Signs BP (!) 140/68   Pulse 81   Temp 98.2 F (36.8 C) (Oral)   Ht 5' 3.5 (1.613 m)   Wt 149 lb 9.6 oz (67.9 kg)   SpO2 99%   BMI 26.08 kg/m    Physical Exam:  General- No distress,  A&Ox3, pleasant and appropriate ENT: No sinus tenderness, TM clear,  pale nasal mucosa, no oral exudate,no post nasal drip, no LAN Cardiac: S1, S2, regular rate and rhythm, no murmur Chest: No wheeze/ rales/ dullness; no accessory muscle use, no nasal flaring, no sternal retractions Abd.: Soft Non-tender, nondistended, bowel sounds positive,Body mass index is 26.08 kg/m.  Ext: No clubbing cyanosis, edema, no obvious deformities Neuro:  normal strength, moving all extremities x 4, alert and oriented x 3, appropriate Skin: No rashes, warm and dry, no obvious skin lesions Psych: normal mood and behavior  Physical Exam    Assessment/Plan Mediastinal lymph node of uncertain significance>> resolved on most recent imaging Former smoker  Solid hypermetabolic precoronal lymph node stable in size with low suspicion of malignancy. Plan Your CT Chest shows stable nodules. This is good news. We will have you join the lung cancer screening program. First scan will be due 08/2025. You will get a call closer to the time to get this scheduled.  Call to be seen sooner if you have any unexplained weight loss or blood in your sputum. Call when you are ready for surgical clearance for your shoulder. Any of the providers in the office can do this for you.  Follow up as needed. Call if you need us  sooner. Happy Holidays. Please contact office for sooner follow up if symptoms do not improve or worsen or seek emergency care   I spent 20 minutes dedicated to the care of this patient on the date of this encounter to include pre-visit review of records, face-to-face time with the patient discussing conditions above, post visit ordering of testing, clinical documentation with the electronic health record, making appropriate referrals as documented, and communicating necessary information to the patient's healthcare team.   Assessment & Plan   Lauraine JULIANNA Lites, NP 09/04/2024  10:04 AM

## 2024-09-04 NOTE — Patient Instructions (Signed)
 It is good to see you today. Your CT Chest shows stable nodules. This is good news. We will have you join the lung cancer screening program. First scan will be due 08/2025. You will get a call closer to the time to get this scheduled.  Call to be seen sooner if you have any unexplained weight loss or blood in your sputum. Call when you are ready for surgical clearance for your shoulder. Any of the providers in the office can do this for you.  Follow up as needed. Call if you need us  sooner. Happy Holidays. Please contact office for sooner follow up if symptoms do not improve or worsen or seek emergency care

## 2024-09-10 NOTE — Telephone Encounter (Signed)
 Per 09/04/24 ov with Sarah:  Call when you are ready for surgical clearance for your shoulder. Any of the providers in the office can do this for you.  Follow up as needed.  Will close encounter for now and address when she is ready.

## 2024-09-30 ENCOUNTER — Encounter: Payer: Self-pay | Admitting: *Deleted

## 2024-12-04 ENCOUNTER — Other Ambulatory Visit

## 2024-12-04 ENCOUNTER — Ambulatory Visit: Admitting: Hematology and Oncology
